# Patient Record
Sex: Female | Born: 1950
Health system: Southern US, Community
[De-identification: ages and names within clinical notes are randomized; demographics above are authoritative.]

## PROBLEM LIST (undated history)

## (undated) ENCOUNTER — Emergency Department (HOSPITAL_BASED_OUTPATIENT_CLINIC_OR_DEPARTMENT_OTHER): Admission: EM | Payer: Medicaid Other | Source: Home / Self Care

## (undated) DIAGNOSIS — M79606 Pain in leg, unspecified: Secondary | ICD-10-CM

## (undated) DIAGNOSIS — M542 Cervicalgia: Secondary | ICD-10-CM

## (undated) DIAGNOSIS — M5416 Radiculopathy, lumbar region: Secondary | ICD-10-CM

## (undated) DIAGNOSIS — K589 Irritable bowel syndrome without diarrhea: Secondary | ICD-10-CM

## (undated) DIAGNOSIS — M797 Fibromyalgia: Secondary | ICD-10-CM

## (undated) DIAGNOSIS — R109 Unspecified abdominal pain: Secondary | ICD-10-CM

## (undated) DIAGNOSIS — I10 Essential (primary) hypertension: Secondary | ICD-10-CM

## (undated) DIAGNOSIS — M549 Dorsalgia, unspecified: Secondary | ICD-10-CM

## (undated) DIAGNOSIS — G8929 Other chronic pain: Secondary | ICD-10-CM

## (undated) DIAGNOSIS — M21969 Unspecified acquired deformity of unspecified lower leg: Secondary | ICD-10-CM

## (undated) DIAGNOSIS — G629 Polyneuropathy, unspecified: Secondary | ICD-10-CM

## (undated) DIAGNOSIS — M7660 Achilles tendinitis, unspecified leg: Secondary | ICD-10-CM

## (undated) HISTORY — DX: Unspecified acquired deformity of unspecified lower leg: M21.969

## (undated) HISTORY — DX: Radiculopathy, lumbar region: M54.16

## (undated) HISTORY — PX: FOOT SURGERY: SHX648

## (undated) HISTORY — DX: Achilles tendinitis, unspecified leg: M76.60

## (undated) HISTORY — DX: Polyneuropathy, unspecified: G62.9

---

## 1997-07-23 ENCOUNTER — Emergency Department (HOSPITAL_COMMUNITY): Admission: EM | Admit: 1997-07-23 | Discharge: 1997-07-23 | Payer: Self-pay | Admitting: Emergency Medicine

## 1998-04-25 ENCOUNTER — Encounter: Payer: Self-pay | Admitting: Internal Medicine

## 1998-04-25 ENCOUNTER — Emergency Department (HOSPITAL_COMMUNITY): Admission: EM | Admit: 1998-04-25 | Discharge: 1998-04-25 | Payer: Self-pay | Admitting: Internal Medicine

## 1998-04-26 ENCOUNTER — Encounter: Payer: Self-pay | Admitting: Emergency Medicine

## 1998-04-26 ENCOUNTER — Emergency Department (HOSPITAL_COMMUNITY): Admission: EM | Admit: 1998-04-26 | Discharge: 1998-04-26 | Payer: Self-pay | Admitting: Emergency Medicine

## 1998-09-11 ENCOUNTER — Emergency Department (HOSPITAL_COMMUNITY): Admission: EM | Admit: 1998-09-11 | Discharge: 1998-09-11 | Payer: Self-pay | Admitting: *Deleted

## 1998-11-20 ENCOUNTER — Emergency Department (HOSPITAL_COMMUNITY): Admission: EM | Admit: 1998-11-20 | Discharge: 1998-11-20 | Payer: Self-pay | Admitting: Emergency Medicine

## 1998-11-22 ENCOUNTER — Encounter: Admission: RE | Admit: 1998-11-22 | Discharge: 1998-11-22 | Payer: Self-pay | Admitting: Internal Medicine

## 1998-11-29 ENCOUNTER — Encounter: Admission: RE | Admit: 1998-11-29 | Discharge: 1998-11-29 | Payer: Self-pay | Admitting: Internal Medicine

## 1998-12-02 ENCOUNTER — Encounter: Payer: Self-pay | Admitting: Emergency Medicine

## 1998-12-02 ENCOUNTER — Emergency Department (HOSPITAL_COMMUNITY): Admission: EM | Admit: 1998-12-02 | Discharge: 1998-12-02 | Payer: Self-pay | Admitting: Emergency Medicine

## 1998-12-13 ENCOUNTER — Emergency Department (HOSPITAL_COMMUNITY): Admission: EM | Admit: 1998-12-13 | Discharge: 1998-12-13 | Payer: Self-pay | Admitting: Emergency Medicine

## 1998-12-13 ENCOUNTER — Encounter: Payer: Self-pay | Admitting: Emergency Medicine

## 1999-09-17 ENCOUNTER — Emergency Department (HOSPITAL_COMMUNITY): Admission: EM | Admit: 1999-09-17 | Discharge: 1999-09-17 | Payer: Self-pay | Admitting: Emergency Medicine

## 1999-10-12 ENCOUNTER — Emergency Department (HOSPITAL_COMMUNITY): Admission: EM | Admit: 1999-10-12 | Discharge: 1999-10-12 | Payer: Self-pay | Admitting: Emergency Medicine

## 1999-10-12 ENCOUNTER — Encounter: Payer: Self-pay | Admitting: Emergency Medicine

## 2000-01-30 ENCOUNTER — Emergency Department (HOSPITAL_COMMUNITY): Admission: EM | Admit: 2000-01-30 | Discharge: 2000-01-30 | Payer: Self-pay | Admitting: Emergency Medicine

## 2000-05-16 ENCOUNTER — Emergency Department (HOSPITAL_COMMUNITY): Admission: EM | Admit: 2000-05-16 | Discharge: 2000-05-17 | Payer: Self-pay | Admitting: Emergency Medicine

## 2000-05-16 ENCOUNTER — Encounter: Payer: Self-pay | Admitting: Emergency Medicine

## 2000-05-29 ENCOUNTER — Encounter: Payer: Self-pay | Admitting: Internal Medicine

## 2000-05-29 ENCOUNTER — Ambulatory Visit (HOSPITAL_COMMUNITY): Admission: RE | Admit: 2000-05-29 | Discharge: 2000-05-29 | Payer: Self-pay | Admitting: Internal Medicine

## 2000-10-30 ENCOUNTER — Encounter: Admission: RE | Admit: 2000-10-30 | Discharge: 2000-11-11 | Payer: Self-pay | Admitting: Sports Medicine

## 2000-10-31 ENCOUNTER — Other Ambulatory Visit: Admission: RE | Admit: 2000-10-31 | Discharge: 2000-10-31 | Payer: Self-pay | Admitting: Internal Medicine

## 2000-10-31 ENCOUNTER — Encounter (INDEPENDENT_AMBULATORY_CARE_PROVIDER_SITE_OTHER): Payer: Self-pay | Admitting: Specialist

## 2000-12-02 ENCOUNTER — Encounter: Admission: RE | Admit: 2000-12-02 | Discharge: 2001-02-25 | Payer: Self-pay | Admitting: Sports Medicine

## 2001-01-08 HISTORY — PX: OTHER SURGICAL HISTORY: SHX169

## 2001-03-11 ENCOUNTER — Encounter: Payer: Self-pay | Admitting: Internal Medicine

## 2001-09-07 ENCOUNTER — Emergency Department (HOSPITAL_COMMUNITY): Admission: EM | Admit: 2001-09-07 | Discharge: 2001-09-07 | Payer: Self-pay | Admitting: Emergency Medicine

## 2001-09-23 ENCOUNTER — Emergency Department (HOSPITAL_COMMUNITY): Admission: EM | Admit: 2001-09-23 | Discharge: 2001-09-23 | Payer: Self-pay | Admitting: Emergency Medicine

## 2001-09-23 ENCOUNTER — Emergency Department (HOSPITAL_COMMUNITY): Admission: EM | Admit: 2001-09-23 | Discharge: 2001-09-23 | Payer: Self-pay | Admitting: Physical Therapy

## 2001-12-10 ENCOUNTER — Encounter: Admission: RE | Admit: 2001-12-10 | Discharge: 2001-12-10 | Payer: Self-pay | Admitting: Internal Medicine

## 2002-08-10 ENCOUNTER — Emergency Department (HOSPITAL_COMMUNITY): Admission: EM | Admit: 2002-08-10 | Discharge: 2002-08-10 | Payer: Self-pay | Admitting: Emergency Medicine

## 2002-08-15 ENCOUNTER — Encounter: Payer: Self-pay | Admitting: Emergency Medicine

## 2002-08-15 ENCOUNTER — Ambulatory Visit (HOSPITAL_COMMUNITY): Admission: RE | Admit: 2002-08-15 | Discharge: 2002-08-15 | Payer: Self-pay | Admitting: Emergency Medicine

## 2002-09-30 ENCOUNTER — Emergency Department (HOSPITAL_COMMUNITY): Admission: EM | Admit: 2002-09-30 | Discharge: 2002-10-01 | Payer: Self-pay | Admitting: Emergency Medicine

## 2002-10-01 ENCOUNTER — Encounter: Payer: Self-pay | Admitting: Emergency Medicine

## 2002-12-31 ENCOUNTER — Encounter: Admission: RE | Admit: 2002-12-31 | Discharge: 2003-02-04 | Payer: Self-pay | Admitting: Sports Medicine

## 2003-01-26 ENCOUNTER — Emergency Department (HOSPITAL_COMMUNITY): Admission: EM | Admit: 2003-01-26 | Discharge: 2003-01-26 | Payer: Self-pay

## 2003-02-17 ENCOUNTER — Ambulatory Visit (HOSPITAL_COMMUNITY): Admission: RE | Admit: 2003-02-17 | Discharge: 2003-02-17 | Payer: Self-pay | Admitting: Sports Medicine

## 2003-03-05 ENCOUNTER — Encounter: Payer: Self-pay | Admitting: Internal Medicine

## 2003-03-12 ENCOUNTER — Encounter: Admission: RE | Admit: 2003-03-12 | Discharge: 2003-03-12 | Payer: Self-pay | Admitting: Internal Medicine

## 2003-03-23 ENCOUNTER — Encounter: Admission: RE | Admit: 2003-03-23 | Discharge: 2003-03-23 | Payer: Self-pay | Admitting: Sports Medicine

## 2003-03-26 ENCOUNTER — Encounter: Admission: RE | Admit: 2003-03-26 | Discharge: 2003-03-26 | Payer: Self-pay | Admitting: Internal Medicine

## 2003-04-01 ENCOUNTER — Encounter: Admission: RE | Admit: 2003-04-01 | Discharge: 2003-04-01 | Payer: Self-pay | Admitting: Internal Medicine

## 2003-09-30 ENCOUNTER — Emergency Department (HOSPITAL_COMMUNITY): Admission: EM | Admit: 2003-09-30 | Discharge: 2003-09-30 | Payer: Self-pay | Admitting: Family Medicine

## 2003-10-31 ENCOUNTER — Emergency Department (HOSPITAL_COMMUNITY): Admission: EM | Admit: 2003-10-31 | Discharge: 2003-10-31 | Payer: Self-pay | Admitting: *Deleted

## 2003-12-15 ENCOUNTER — Ambulatory Visit: Payer: Self-pay | Admitting: Internal Medicine

## 2003-12-15 ENCOUNTER — Ambulatory Visit (HOSPITAL_COMMUNITY): Admission: RE | Admit: 2003-12-15 | Discharge: 2003-12-15 | Payer: Self-pay | Admitting: Internal Medicine

## 2004-01-08 ENCOUNTER — Emergency Department (HOSPITAL_COMMUNITY): Admission: EM | Admit: 2004-01-08 | Discharge: 2004-01-08 | Payer: Self-pay | Admitting: Emergency Medicine

## 2004-01-09 ENCOUNTER — Emergency Department (HOSPITAL_COMMUNITY): Admission: EM | Admit: 2004-01-09 | Discharge: 2004-01-09 | Payer: Self-pay | Admitting: Family Medicine

## 2004-01-12 ENCOUNTER — Ambulatory Visit: Payer: Self-pay | Admitting: Internal Medicine

## 2004-03-08 ENCOUNTER — Emergency Department (HOSPITAL_COMMUNITY): Admission: EM | Admit: 2004-03-08 | Discharge: 2004-03-08 | Payer: Self-pay | Admitting: Family Medicine

## 2004-05-04 ENCOUNTER — Emergency Department (HOSPITAL_COMMUNITY): Admission: EM | Admit: 2004-05-04 | Discharge: 2004-05-04 | Payer: Self-pay | Admitting: Family Medicine

## 2004-05-15 ENCOUNTER — Ambulatory Visit: Payer: Self-pay | Admitting: Internal Medicine

## 2004-05-15 ENCOUNTER — Ambulatory Visit (HOSPITAL_COMMUNITY): Admission: RE | Admit: 2004-05-15 | Discharge: 2004-05-15 | Payer: Self-pay | Admitting: Internal Medicine

## 2004-07-06 ENCOUNTER — Emergency Department (HOSPITAL_COMMUNITY): Admission: EM | Admit: 2004-07-06 | Discharge: 2004-07-06 | Payer: Self-pay | Admitting: Family Medicine

## 2004-07-18 ENCOUNTER — Ambulatory Visit: Payer: Self-pay | Admitting: Internal Medicine

## 2004-07-19 ENCOUNTER — Ambulatory Visit: Payer: Self-pay | Admitting: Internal Medicine

## 2004-08-02 ENCOUNTER — Ambulatory Visit: Payer: Self-pay

## 2004-09-18 ENCOUNTER — Emergency Department (HOSPITAL_COMMUNITY): Admission: EM | Admit: 2004-09-18 | Discharge: 2004-09-18 | Payer: Self-pay | Admitting: Emergency Medicine

## 2004-12-27 ENCOUNTER — Ambulatory Visit: Payer: Self-pay | Admitting: Internal Medicine

## 2005-06-13 ENCOUNTER — Emergency Department (HOSPITAL_COMMUNITY): Admission: EM | Admit: 2005-06-13 | Discharge: 2005-06-14 | Payer: Self-pay | Admitting: Emergency Medicine

## 2005-06-18 ENCOUNTER — Ambulatory Visit: Payer: Self-pay | Admitting: Internal Medicine

## 2005-06-19 ENCOUNTER — Emergency Department (HOSPITAL_COMMUNITY): Admission: EM | Admit: 2005-06-19 | Discharge: 2005-06-19 | Payer: Self-pay | Admitting: Emergency Medicine

## 2005-06-26 ENCOUNTER — Ambulatory Visit: Payer: Self-pay | Admitting: Internal Medicine

## 2005-07-31 ENCOUNTER — Emergency Department (HOSPITAL_COMMUNITY): Admission: EM | Admit: 2005-07-31 | Discharge: 2005-07-31 | Payer: Self-pay | Admitting: Emergency Medicine

## 2005-07-31 ENCOUNTER — Encounter: Payer: Self-pay | Admitting: Vascular Surgery

## 2005-08-10 ENCOUNTER — Emergency Department (HOSPITAL_COMMUNITY): Admission: EM | Admit: 2005-08-10 | Discharge: 2005-08-10 | Payer: Self-pay | Admitting: Emergency Medicine

## 2006-02-17 ENCOUNTER — Emergency Department (HOSPITAL_COMMUNITY): Admission: EM | Admit: 2006-02-17 | Discharge: 2006-02-17 | Payer: Self-pay | Admitting: Emergency Medicine

## 2006-03-11 ENCOUNTER — Emergency Department (HOSPITAL_COMMUNITY): Admission: EM | Admit: 2006-03-11 | Discharge: 2006-03-11 | Payer: Self-pay | Admitting: Family Medicine

## 2006-05-18 ENCOUNTER — Emergency Department (HOSPITAL_COMMUNITY): Admission: EM | Admit: 2006-05-18 | Discharge: 2006-05-18 | Payer: Self-pay | Admitting: Emergency Medicine

## 2006-07-06 ENCOUNTER — Emergency Department (HOSPITAL_COMMUNITY): Admission: EM | Admit: 2006-07-06 | Discharge: 2006-07-07 | Payer: Self-pay | Admitting: Occupational Medicine

## 2006-07-27 ENCOUNTER — Emergency Department (HOSPITAL_COMMUNITY): Admission: EM | Admit: 2006-07-27 | Discharge: 2006-07-27 | Payer: Self-pay | Admitting: Emergency Medicine

## 2006-12-14 ENCOUNTER — Emergency Department (HOSPITAL_COMMUNITY): Admission: EM | Admit: 2006-12-14 | Discharge: 2006-12-14 | Payer: Self-pay | Admitting: Emergency Medicine

## 2008-05-12 ENCOUNTER — Emergency Department (HOSPITAL_COMMUNITY): Admission: EM | Admit: 2008-05-12 | Discharge: 2008-05-13 | Payer: Self-pay | Admitting: Emergency Medicine

## 2008-08-28 ENCOUNTER — Emergency Department (HOSPITAL_COMMUNITY): Admission: EM | Admit: 2008-08-28 | Discharge: 2008-08-29 | Payer: Self-pay | Admitting: Emergency Medicine

## 2008-12-02 ENCOUNTER — Emergency Department (HOSPITAL_COMMUNITY): Admission: EM | Admit: 2008-12-02 | Discharge: 2008-12-02 | Payer: Self-pay | Admitting: Emergency Medicine

## 2008-12-02 ENCOUNTER — Encounter: Payer: Self-pay | Admitting: Physician Assistant

## 2008-12-02 ENCOUNTER — Encounter (INDEPENDENT_AMBULATORY_CARE_PROVIDER_SITE_OTHER): Payer: Self-pay | Admitting: *Deleted

## 2008-12-06 ENCOUNTER — Telehealth: Payer: Self-pay | Admitting: Internal Medicine

## 2008-12-07 ENCOUNTER — Encounter: Admission: RE | Admit: 2008-12-07 | Discharge: 2008-12-07 | Payer: Self-pay | Admitting: Podiatry

## 2008-12-07 ENCOUNTER — Ambulatory Visit: Payer: Self-pay | Admitting: Internal Medicine

## 2008-12-07 DIAGNOSIS — R1084 Generalized abdominal pain: Secondary | ICD-10-CM | POA: Insufficient documentation

## 2008-12-07 DIAGNOSIS — R634 Abnormal weight loss: Secondary | ICD-10-CM | POA: Insufficient documentation

## 2008-12-10 LAB — CONVERTED CEMR LAB
Sed Rate: 25 mm/hr — ABNORMAL HIGH (ref 0–22)
TSH: 1.41 microintl units/mL (ref 0.35–5.50)

## 2008-12-21 ENCOUNTER — Ambulatory Visit: Payer: Self-pay | Admitting: Internal Medicine

## 2008-12-22 ENCOUNTER — Telehealth: Payer: Self-pay | Admitting: Internal Medicine

## 2008-12-23 ENCOUNTER — Encounter: Payer: Self-pay | Admitting: Internal Medicine

## 2008-12-24 LAB — CONVERTED CEMR LAB: UREASE: POSITIVE

## 2008-12-27 ENCOUNTER — Encounter: Payer: Self-pay | Admitting: Internal Medicine

## 2008-12-29 ENCOUNTER — Encounter (INDEPENDENT_AMBULATORY_CARE_PROVIDER_SITE_OTHER): Payer: Self-pay | Admitting: *Deleted

## 2009-01-03 ENCOUNTER — Emergency Department (HOSPITAL_COMMUNITY): Admission: EM | Admit: 2009-01-03 | Discharge: 2009-01-04 | Payer: Self-pay | Admitting: Emergency Medicine

## 2009-01-03 ENCOUNTER — Encounter: Admission: RE | Admit: 2009-01-03 | Discharge: 2009-01-03 | Payer: Self-pay | Admitting: Sports Medicine

## 2009-01-05 ENCOUNTER — Encounter: Admission: RE | Admit: 2009-01-05 | Discharge: 2009-01-05 | Payer: Self-pay | Admitting: Sports Medicine

## 2009-01-19 ENCOUNTER — Emergency Department (HOSPITAL_COMMUNITY): Admission: EM | Admit: 2009-01-19 | Discharge: 2009-01-19 | Payer: Self-pay | Admitting: Emergency Medicine

## 2009-02-10 DIAGNOSIS — A048 Other specified bacterial intestinal infections: Secondary | ICD-10-CM | POA: Insufficient documentation

## 2009-02-10 DIAGNOSIS — Z860100 Personal history of colon polyps, unspecified: Secondary | ICD-10-CM | POA: Insufficient documentation

## 2009-02-10 DIAGNOSIS — K589 Irritable bowel syndrome without diarrhea: Secondary | ICD-10-CM | POA: Insufficient documentation

## 2009-02-10 DIAGNOSIS — E785 Hyperlipidemia, unspecified: Secondary | ICD-10-CM | POA: Insufficient documentation

## 2009-02-10 DIAGNOSIS — R109 Unspecified abdominal pain: Secondary | ICD-10-CM | POA: Insufficient documentation

## 2009-02-16 ENCOUNTER — Ambulatory Visit: Payer: Self-pay | Admitting: Internal Medicine

## 2009-02-16 DIAGNOSIS — R1013 Epigastric pain: Secondary | ICD-10-CM | POA: Insufficient documentation

## 2009-02-16 DIAGNOSIS — R142 Eructation: Secondary | ICD-10-CM

## 2009-02-16 DIAGNOSIS — R143 Flatulence: Secondary | ICD-10-CM

## 2009-02-16 DIAGNOSIS — R141 Gas pain: Secondary | ICD-10-CM | POA: Insufficient documentation

## 2009-02-17 ENCOUNTER — Ambulatory Visit: Payer: Self-pay | Admitting: Cardiology

## 2009-02-18 ENCOUNTER — Encounter: Payer: Self-pay | Admitting: Internal Medicine

## 2009-02-18 LAB — CONVERTED CEMR LAB
ALT: 26 units/L (ref 0–35)
Albumin: 3.7 g/dL (ref 3.5–5.2)
Basophils Absolute: 0 10*3/uL (ref 0.0–0.1)
CO2: 29 meq/L (ref 19–32)
Eosinophils Relative: 1.3 % (ref 0.0–5.0)
GFR calc non Af Amer: 83.1 mL/min (ref >60)
Glucose, Bld: 96 mg/dL (ref 70–99)
Lymphocytes Relative: 26.9 % (ref 12.0–46.0)
Lymphs Abs: 1.7 10*3/uL (ref 0.7–4.0)
Monocytes Relative: 8.5 % (ref 3.0–12.0)
Neutrophils Relative %: 62.7 % (ref 43.0–77.0)
Platelets: 305 10*3/uL (ref 150.0–400.0)
Potassium: 4.2 meq/L (ref 3.5–5.1)
RDW: 13.2 % (ref 11.5–14.6)
Sodium: 142 meq/L (ref 135–145)
Total Bilirubin: 0.4 mg/dL (ref 0.3–1.2)
Total Protein: 7.9 g/dL (ref 6.0–8.3)
WBC: 6.3 10*3/uL (ref 4.5–10.5)

## 2009-02-22 ENCOUNTER — Telehealth: Payer: Self-pay | Admitting: Internal Medicine

## 2009-02-25 ENCOUNTER — Telehealth: Payer: Self-pay | Admitting: Internal Medicine

## 2009-03-31 ENCOUNTER — Ambulatory Visit: Payer: Self-pay | Admitting: Internal Medicine

## 2009-03-31 LAB — CONVERTED CEMR LAB
Lipase: 22 units/L (ref 11.0–59.0)
TSH: 1.21 microintl units/mL (ref 0.35–5.50)

## 2009-04-04 ENCOUNTER — Ambulatory Visit (HOSPITAL_COMMUNITY): Admission: RE | Admit: 2009-04-04 | Discharge: 2009-04-04 | Payer: Self-pay | Admitting: Internal Medicine

## 2009-04-14 ENCOUNTER — Emergency Department (HOSPITAL_COMMUNITY): Admission: EM | Admit: 2009-04-14 | Discharge: 2009-04-14 | Payer: Self-pay | Admitting: Emergency Medicine

## 2009-04-15 ENCOUNTER — Telehealth: Payer: Self-pay | Admitting: Internal Medicine

## 2009-05-16 ENCOUNTER — Telehealth: Payer: Self-pay | Admitting: Internal Medicine

## 2009-07-07 ENCOUNTER — Emergency Department (HOSPITAL_COMMUNITY): Admission: EM | Admit: 2009-07-07 | Discharge: 2009-07-07 | Payer: Self-pay | Admitting: Emergency Medicine

## 2009-07-30 ENCOUNTER — Encounter (INDEPENDENT_AMBULATORY_CARE_PROVIDER_SITE_OTHER): Payer: Self-pay | Admitting: *Deleted

## 2009-07-30 ENCOUNTER — Emergency Department (HOSPITAL_COMMUNITY): Admission: EM | Admit: 2009-07-30 | Discharge: 2009-07-30 | Payer: Self-pay | Admitting: Emergency Medicine

## 2009-08-01 ENCOUNTER — Telehealth: Payer: Self-pay | Admitting: Internal Medicine

## 2009-08-03 ENCOUNTER — Encounter: Payer: Self-pay | Admitting: Internal Medicine

## 2009-08-03 ENCOUNTER — Telehealth (INDEPENDENT_AMBULATORY_CARE_PROVIDER_SITE_OTHER): Payer: Self-pay

## 2009-08-10 ENCOUNTER — Emergency Department (HOSPITAL_COMMUNITY): Admission: EM | Admit: 2009-08-10 | Discharge: 2009-08-10 | Payer: Self-pay | Admitting: Emergency Medicine

## 2009-08-15 ENCOUNTER — Ambulatory Visit: Payer: Self-pay | Admitting: Internal Medicine

## 2009-09-20 ENCOUNTER — Ambulatory Visit: Payer: Self-pay | Admitting: Internal Medicine

## 2009-09-21 ENCOUNTER — Telehealth (INDEPENDENT_AMBULATORY_CARE_PROVIDER_SITE_OTHER): Payer: Self-pay | Admitting: *Deleted

## 2009-11-13 ENCOUNTER — Encounter: Payer: Self-pay | Admitting: Internal Medicine

## 2010-01-14 ENCOUNTER — Emergency Department (HOSPITAL_COMMUNITY)
Admission: EM | Admit: 2010-01-14 | Discharge: 2010-01-15 | Payer: Self-pay | Source: Home / Self Care | Admitting: Emergency Medicine

## 2010-01-15 ENCOUNTER — Observation Stay (HOSPITAL_COMMUNITY)
Admission: EM | Admit: 2010-01-15 | Discharge: 2010-01-16 | Payer: Self-pay | Source: Home / Self Care | Attending: Internal Medicine | Admitting: Internal Medicine

## 2010-01-23 LAB — BASIC METABOLIC PANEL
BUN: 13 mg/dL (ref 6–23)
BUN: 9 mg/dL (ref 6–23)
CO2: 28 mEq/L (ref 19–32)
CO2: 27 mEq/L (ref 19–32)
Calcium: 9.7 mg/dL (ref 8.4–10.5)
Calcium: 9.1 mg/dL (ref 8.4–10.5)
Chloride: 106 mEq/L (ref 96–112)
Chloride: 106 mEq/L (ref 96–112)
Creatinine, Ser: 0.89 mg/dL (ref 0.4–1.2)
Creatinine, Ser: 0.97 mg/dL (ref 0.4–1.2)
GFR calc Af Amer: >60 mL/min (ref >60)
GFR calc Af Amer: >60 mL/min (ref >60)
GFR calc non Af Amer: >60 mL/min (ref >60)
GFR calc non Af Amer: 59 mL/min — ABNORMAL LOW (ref >60)
Glucose, Bld: 108 mg/dL — ABNORMAL HIGH (ref 70–99)
Glucose, Bld: 100 mg/dL — ABNORMAL HIGH (ref 70–99)
Potassium: 3.9 mEq/L (ref 3.5–5.1)
Potassium: 3.8 mEq/L (ref 3.5–5.1)
Sodium: 142 mEq/L (ref 135–145)
Sodium: 141 mEq/L (ref 135–145)

## 2010-01-23 LAB — CBC
HCT: 41.3 % (ref 36.0–46.0)
HCT: 38.5 % (ref 36.0–46.0)
Hemoglobin: 13.4 g/dL (ref 12.0–15.0)
Hemoglobin: 11.8 g/dL — ABNORMAL LOW (ref 12.0–15.0)
MCH: 27.5 pg (ref 26.0–34.0)
MCH: 26.1 pg (ref 26.0–34.0)
MCHC: 32.4 g/dL (ref 30.0–36.0)
MCHC: 30.6 g/dL (ref 30.0–36.0)
MCV: 84.8 fL (ref 78.0–100.0)
MCV: 85.2 fL (ref 78.0–100.0)
Platelets: 315 10*3/uL (ref 150–400)
Platelets: 292 10*3/uL (ref 150–400)
RBC: 4.87 MIL/uL (ref 3.87–5.11)
RBC: 4.52 MIL/uL (ref 3.87–5.11)
RDW: 13.9 % (ref 11.5–15.5)
RDW: 14 % (ref 11.5–15.5)
WBC: 11.8 10*3/uL — ABNORMAL HIGH (ref 4.0–10.5)
WBC: 8.8 10*3/uL (ref 4.0–10.5)

## 2010-01-23 LAB — HEPATIC FUNCTION PANEL
ALT: 21 U/L (ref 0–35)
AST: 22 U/L (ref 0–37)
Albumin: 3.7 g/dL (ref 3.5–5.2)
Alkaline Phosphatase: 87 U/L (ref 39–117)
Bilirubin, Direct: 0.1 mg/dL (ref 0.0–0.3)
Indirect Bilirubin: 0.4 mg/dL (ref 0.3–0.9)
Total Bilirubin: 0.5 mg/dL (ref 0.3–1.2)
Total Protein: 7.9 g/dL (ref 6.0–8.3)

## 2010-01-23 LAB — URINALYSIS, ROUTINE W REFLEX MICROSCOPIC
Bilirubin Urine: NEGATIVE
Hgb urine dipstick: NEGATIVE
Ketones, ur: 15 mg/dL — AB
Nitrite: NEGATIVE
Protein, ur: NEGATIVE mg/dL
Specific Gravity, Urine: 1.016 (ref 1.005–1.030)
Urine Glucose, Fasting: NEGATIVE mg/dL
Urobilinogen, UA: 1 mg/dL (ref 0.0–1.0)
pH: 6.5 (ref 5.0–8.0)

## 2010-01-23 LAB — LIPASE, BLOOD: Lipase: 50 U/L (ref 11–59)

## 2010-01-23 LAB — DIFFERENTIAL
Basophils Absolute: 0 10*3/uL (ref 0.0–0.1)
Basophils Absolute: 0 10*3/uL (ref 0.0–0.1)
Basophils Relative: 0 % (ref 0–1)
Basophils Relative: 0 % (ref 0–1)
Eosinophils Absolute: 0 10*3/uL (ref 0.0–0.7)
Eosinophils Absolute: 0 10*3/uL (ref 0.0–0.7)
Eosinophils Relative: 0 % (ref 0–5)
Eosinophils Relative: 1 % (ref 0–5)
Lymphocytes Relative: 8 % — ABNORMAL LOW (ref 12–46)
Lymphocytes Relative: 26 % (ref 12–46)
Lymphs Abs: 1 10*3/uL (ref 0.7–4.0)
Lymphs Abs: 2.3 10*3/uL (ref 0.7–4.0)
Monocytes Absolute: 0.4 10*3/uL (ref 0.1–1.0)
Monocytes Absolute: 0.7 10*3/uL (ref 0.1–1.0)
Monocytes Relative: 3 % (ref 3–12)
Monocytes Relative: 8 % (ref 3–12)
Neutro Abs: 10.5 10*3/uL — ABNORMAL HIGH (ref 1.7–7.7)
Neutro Abs: 5.7 10*3/uL (ref 1.7–7.7)
Neutrophils Relative %: 88 % — ABNORMAL HIGH (ref 43–77)
Neutrophils Relative %: 65 % (ref 43–77)

## 2010-01-28 ENCOUNTER — Encounter: Payer: Self-pay | Admitting: Internal Medicine

## 2010-01-28 ENCOUNTER — Encounter: Payer: Self-pay | Admitting: Sports Medicine

## 2010-01-29 ENCOUNTER — Encounter: Payer: Self-pay | Admitting: Internal Medicine

## 2010-01-29 ENCOUNTER — Encounter: Payer: Self-pay | Admitting: Podiatry

## 2010-02-07 NOTE — Assessment & Plan Note (Signed)
Summary: ecl f/u...as.   History of Present Illness Visit Type: Follow-up Visit Primary GI MD: Lina Sar MD Primary Provider: n/a Chief Complaint: F/U Endo, patient having epigastric  pain and bloating History of Present Illness:   This is a 60 year old African American female with intermittent mid abdominal pain and lower abdominal pain which is crampy and lasts about one day. She denies any change in bowel habits which seem to be regular. She denies any rectal bleeding. An upper endoscopy and colonoscopy done in December 2010 was positive for H. pylori and she she also had 3 hyperplastic polyps. The colon was redundant and the cecal pouch was never visualized completely. Her sedimentation rate was slightly elevated at 25. Most of her abdominal pain started after her mother passed away last year. She had weight loss but since then has gained all the way back to her original weight. She was evaluated in the emergency room in November 2010 for abdominal pain but all tests were negative.   GI Review of Systems    Reports abdominal pain and  bloating.     Location of  Abdominal pain: epigastric area.    Denies acid reflux, belching, chest pain, dysphagia with liquids, dysphagia with solids, heartburn, loss of appetite, nausea, vomiting, vomiting blood, weight loss, and  weight gain.        Denies anal fissure, black tarry stools, change in bowel habit, constipation, diarrhea, diverticulosis, fecal incontinence, heme positive stool, hemorrhoids, irritable bowel syndrome, jaundice, light color stool, liver problems, rectal bleeding, and  rectal pain.    Current Medications (verified): 1)  None  Allergies (verified): 1)  ! Penicillin  Past History:  Past Medical History: Reviewed history from 02/10/2009 and no changes required. Arthritis  COLONIC POLYPS, HYPERPLASTIC, HX OF (ICD-V12.72) HELICOBACTER PYLORI INFECTION (ICD-041.86) HYPERLIPIDEMIA (ICD-272.4) IRRITABLE BOWEL SYNDROME  (ICD-564.1) ABDOMINAL PAIN, CHRONIC (ICD-789.00) LOSS OF WEIGHT (ICD-783.21) ABDOMINAL PAIN, GENERALIZED (ICD-789.07)    Past Surgical History: Tubal Ligation Eye Surgery- for a growth on eyelid Foot Surgery  Family History: Reviewed history from 02/10/2009 and no changes required. Family History of Diabetes: Mother Family History of Heart Disease: Mother, Maternal Grandmother   Social History: Reviewed history from 12/07/2008 and no changes required. Married Patient has never smoked.  Alcohol Use - no Daily Caffeine Use Illicit Drug Use - no  Review of Systems       The patient complains of anxiety-new, arthritis/joint pain, back pain, cough, depression-new, fainting, fatigue, headaches-new, muscle pains/cramps, sleeping problems, and urine leakage.         Pertinent positive and negative review of systems were noted in the above HPI. All other ROS was otherwise negative.   Vital Signs:  Patient profile:   60 year old female Height:      67 inches Weight:      178 pounds BMI:     27.98 Pulse rate:   72 / minute Pulse rhythm:   regular BP sitting:   124 / 82  (left arm) Cuff size:   regular  Vitals Entered By: June McMurray CMA Duncan Dull) (February 16, 2009 9:26 AM)  Physical Exam  General:  Well developed, well nourished, no acute distress. Mouth:  No deformity or lesions, dentition normal. Neck:  Supple; no masses or thyromegaly. Lungs:  Clear throughout to auscultation. Heart:  Regular rate and rhythm; no murmurs, rubs,  or bruits. Abdomen:  protuberant abdomen which is soft with normoactive bowel sounds. No distention. Mild tenderness in left lower and  left middle quadrant, no CVA tenderness. Liver edge at costal margin. Rectal:  large amount of solid Hemoccult-negative stool in the rectal ampulla. Extremities:  No clubbing, cyanosis, edema or deformities noted. Skin:  Intact without significant lesions or rashes. Psych:  Alert and cooperative. Normal mood and  affect.   Impression & Recommendations:  Problem # 1:  ABDOMINAL BLOATING (ICD-787.3) Patient has lower abdominal pain, probably functional but I cannot rule out organic disease. We will schedule a CT scan of the abdomen and pelvis. We will also obtain a CBC and CMET. The pain may be due to constipation. I have given her samples of MiraLax 9-17 g p.r.n. and phenergan 25 mg p.r.n. nausea. Orders: CT Abdomen/Pelvis with Contrast (CT Abd/Pelvis w/con) TLB-CBC Platelet - w/Differential (85025-CBCD) TLB-CMP (Comprehensive Metabolic Pnl) (80053-COMP)  Problem # 2:  COLONIC POLYPS, HYPERPLASTIC, HX OF (ICD-V12.72) Patient has a history of hyperplastic colon polyps. She will be due for a recall colonoscopy in 10 years.  Problem # 3:  HELICOBACTER PYLORI INFECTION (ICD-041.86) Patient had a positive H. pylori on her last upper endoscopy. Patient does not have any upper GI symptoms and therefore we will not treat it at this time.  Patient Instructions: 1)  MiraLax 9-17 g p.r.n. 2)  CT Scan of the abdomen and pelvis. 3)  CBC and metabolic panel. 4)  Phenergan 25 mg dispensed 20 p.r.n. nausea. 5)  The medication list was reviewed and reconciled.  All changed / newly prescribed medications were explained.  A complete medication list was provided to the patient / caregiver. Prescriptions: PROMETHAZINE HCL 25 MG TABS (PROMETHAZINE HCL) Take 1 tablet by mouth every 4-6 hours as needed for nausea  #20 x 0   Entered by:   Hortense Ramal CMA (AAMA)   Authorized by:   Hart Carwin MD   Signed by:   Hortense Ramal CMA (AAMA) on 02/16/2009   Method used:   Electronically to        [facility] (retail)       [address]       [place], [place]  [id number]       Ph: [phone]       Fax: [phone]   RxID:   [id number] MIRALAX   POWD (POLYETHYLENE GLYCOL 3350) Take 1/2 scoop by mouth as needed abdominal pain and cramping  #527 g x 1   Entered by:   Hortense Ramal CMA  (AAMA)   Authorized by:   Hart Carwin MD   Signed by:   Hortense Ramal CMA (AAMA) on 02/16/2009   Method used:   Electronically to        [facility] (retail)       [address]       [place], [place]  [id number]       Ph: [phone]       Fax: [phone]   RxID:   [id number]

## 2010-02-07 NOTE — Letter (Signed)
Summary: Results Letter  [facility]  [address] [place], [place] [address]   Phone: [phone]  Fax: [phone]        February 18, 2009 MRN: [id number]    [facility] [place] [address] [address] [place], [place]  [address]    Dear Ms. Caissie,     After multiple attempts to reach you by phone I am sending this letter. Dr.Eugenie Harewood wants you to know that your labs are normal. Your CT is normal, no cancer, everything looks good. You do have a fatty liver, I have enclosed a brochure on that condition for you. You are also slightly constipated, Dr.Nat Lowenthal recommends you use a laxative or stool softner as needed. If you have any questions please feel free to call us at [phone]. Thank You.       Sincerely,  Laureen Ochs LPN  This letter has been electronically signed by your physician.  Appended Document: Results Letter Letter mailed to patient.

## 2010-02-07 NOTE — Assessment & Plan Note (Signed)
Summary: Gastroenterology   Kelly Rosales  MR#:  [id number] Page #  [facility]   GASTROENTEROLOGY OFFICE NOTE  NAME:  Kelly, Rosales   OFFICE NO:  [id number]  DATE:  03/11/01  The patient is a 60 year old African American female with chronic abdominal pain and irritable bowel syndrome.  Her last evaluation with colonoscopy in October of 2002 showed a polyp in the rectum, which was removed.  She also has problems with constipation, low back pain, and most recently urinary frequency.  She was given Pyridium on the last office visit in November and has been taking it pretty much regularly on a daily basis.  She had requested a refill on Pyridium, but is was denied because of the need for her to be further evaluated for her symptoms.  PHYSICAL EXAMINATION:  Blood pressure 110/68, pulse 60, Weight 168 pounds.  Abdomen:  Exam showed surgical scars from previous pelvic surgeries.  Bowel sounds were active.  There was tenderness in the right lower quadrant.  Straight leg raising was negative.  There was mild right CVA tenderness.  Rectal exam:  Not done today.  IMPRESSION:  Urinary frequency relieved by Pyridium, rule out interstitial cystitis, rule out urinary tract infection or some urologic problem.  PLAN: 1.  Urinalysis today. 2.  Urine culture. 3.  One more refill on Pyridium.  We will no longer refill it since the patient needs further evaluation.  She needs an internist to sort this out.  I gave her names of some internists in our office.      Hedwig Morton. Juanda Chance, M.D.  [id number] D:  03/11/01; T:  ; Job [phone]

## 2010-02-07 NOTE — Assessment & Plan Note (Signed)
Summary: abdominal pain--ch   History of Present Illness Visit Type: Follow-up Visit Primary GI MD: Lina Sar MD Primary Provider: n/a Requesting Provider: n/a Chief Complaint: Intermittant abdominal pain, Occasional N/V History of Present Illness:   60 yo AA female with weight loss, normal EGD/colonoscopy 12/2008 and CT scan of the abd and pelvis, wakes up at night with abd. pain. She has been depressed since the death of her mother and granddaughter. There was a mention of cholelithiasis on one of her prior  imaging studies.    GI Review of Systems    Reports abdominal pain.     Location of  Abdominal pain: lower abdomen.    Denies acid reflux, belching, bloating, chest pain, dysphagia with liquids, dysphagia with solids, heartburn, loss of appetite, nausea, vomiting, vomiting blood, weight loss, and  weight gain.        Denies anal fissure, black tarry stools, change in bowel habit, constipation, diarrhea, diverticulosis, fecal incontinence, heme positive stool, hemorrhoids, irritable bowel syndrome, jaundice, light color stool, liver problems, rectal bleeding, and  rectal pain.    Current Medications (verified): 1)  Promethazine Hcl 25 Mg Tabs (Promethazine Hcl) .... Take 1 Tablet By Mouth Every 4-6 Hours As Needed For Nausea 2)  Donnatal  Tabs (Belladonna Alk-Phenobarbital) .... Take 1 Tablet By Mouth Three Times A Day Before Meals. (Pharmacy-Please D/c Librax Prescription)  Allergies (verified): 1)  ! Penicillin  Past History:  Past Medical History: Last updated: 02/10/2009 Arthritis  COLONIC POLYPS, HYPERPLASTIC, HX OF (ICD-V12.72) HELICOBACTER PYLORI INFECTION (ICD-041.86) HYPERLIPIDEMIA (ICD-272.4) IRRITABLE BOWEL SYNDROME (ICD-564.1) ABDOMINAL PAIN, CHRONIC (ICD-789.00) LOSS OF WEIGHT (ICD-783.21) ABDOMINAL PAIN, GENERALIZED (ICD-789.07)    Past Surgical History: Last updated: 02/16/2009 Tubal Ligation Eye Surgery- for a growth on eyelid Foot  Surgery  Family History: Last updated: 02/10/2009 Family History of Diabetes: Mother Family History of Heart Disease: Mother, Maternal Grandmother   Social History: Last updated: 12/07/2008 Married Patient has never smoked.  Alcohol Use - no Daily Caffeine Use Illicit Drug Use - no  Review of Systems       The patient complains of arthritis/joint pain.  The patient denies allergy/sinus, anemia, anxiety-new, back pain, blood in urine, breast changes/lumps, change in vision, confusion, cough, coughing up blood, depression-new, fainting, fatigue, fever, headaches-new, hearing problems, heart murmur, heart rhythm changes, itching, menstrual pain, muscle pains/cramps, night sweats, nosebleeds, pregnancy symptoms, shortness of breath, skin rash, sleeping problems, sore throat, swelling of feet/legs, swollen lymph glands, thirst - excessive , urination - excessive , urination changes/pain, urine leakage, vision changes, and voice change.         Pertinent positive and negative review of systems were noted in the above HPI. All other ROS was otherwise negative.   Vital Signs:  Patient profile:   60 year old female Height:      67 inches Weight:      179 pounds BMI:     28.14 BSA:     1.93 Pulse rate:   80 / minute Pulse rhythm:   regular BP sitting:   102 / 82  (left arm)  Vitals Entered By: Merri Ray CMA Duncan Dull) (March 31, 2009 10:17 AM)  Physical Exam  General:  appears depressed Eyes:  PERRLA, no icterus. Mouth:  No deformity or lesions, dentition normal. Neck:  Supple; no masses or thyromegaly. Lungs:  Clear throughout to auscultation. Heart:  Regular rate and rhythm; no murmurs, rubs,  or bruits. Abdomen:  soft, tender periumbilical area, no mass or hernia, liver edge  at CM, no ascites Extremities:  No clubbing, cyanosis, edema or deformities noted. Skin:  Intact without significant lesions or rashes. Psych:  Alert and cooperative. Normal mood and  affect.   Impression & Recommendations:  Problem # 1:  ABDOMINAL BLOATING (ICD-787.3) r/o biliary disease or partial SBO Orr/o biliary disease, r/oders: TLB-TSH (Thyroid Stimulating Hormone) (84443-TSH) TLB-Sedimentation Rate (ESR) (85652-ESR) TLB-Amylase (82150-AMYL) TLB-Lipase (83690-LIPASE) Ultrasound Abdomen (UAS) UGI with SBFT (UGI w/SBFT)  Problem # 2:  COLONIC POLYPS, HYPERPLASTIC, HX OF (ICD-V12.72) recall colon 10 years, last colon 12/2008  Problem # 3:  IRRITABLE BOWEL SYNDROME (ICD-564.1) abd. pain may be functional.   Patient Instructions: 1)  You have been scheduled for an abdominal ultrasound as well as an upper GI with small bowel follow thru on 04/05/09. Please arrive at 7:45 am on that day at [facility].  2)  After midnight on 04/04/09, you should have nothing to eat or drink until after your radiology tests. 3)  Please go to the basement floor to have labwork including: TSH, sedimentation rate, amylase, and lipase. 4)  Please pick up your prescription fo Valium 5 mg. That prescription has already been sent to your pharmacy by fax. 5)  The medication list was reviewed and reconciled.  All changed / newly prescribed medications were explained.  A complete medication list was provided to the patient / caregiver. Prescriptions: VALIUM 5 MG TABS (DIAZEPAM) Take 1 tablet by mouth as needed for severe pain  #30 x 0   Entered by:   Hortense Ramal CMA (AAMA)   Authorized by:   Hart Carwin MD   Signed by:   Hortense Ramal CMA (AAMA) on 03/31/2009   Method used:   Printed then faxed to ...       [facility] [address] [phone][facility] (retail)       [address]       [place], [place]  [address]       Ph: [phone] or [phone]       Fax: [phone]   RxID:   [id number]

## 2010-02-07 NOTE — Progress Notes (Signed)
Summary: ON CALL -phone note  Phone Note Call from Patient   Caller: Patient via Answering Service Summary of Call: patient called answering service for "vomiting and pain". Call returned but no answer. Looks like the office had trouble reaching her earlier today as well... Initial call taken by: Hilarie Fredrickson MD,  August 01, 2009 7:10 PM  Follow-up for Phone Call        pt known to me, chronic functional abd. pain. Extensive GI work up is negative.  We need to be careful dispensing pain meds. Follow-up by: Hart Carwin MD,  August 01, 2009 10:25 PM

## 2010-02-07 NOTE — Cardiovascular Report (Signed)
Summary: Certified Letter Returned  Certified Letter Returned   Imported By: Debby Freiberg 11/22/2009 11:23:41  _____________________________________________________________________  External Attachment:    Type:   Image     Comment:   External Document

## 2010-02-07 NOTE — Progress Notes (Signed)
Summary: Dismissal Letter Mailed by Certified Mail  Dismissal Letter sent by certified mail. Vara Guardian  September 21, 2009 11:14 AM

## 2010-02-07 NOTE — Letter (Signed)
Summary: GASTROENTEROLOGY DISCHARGE LETTER!  [facility]  [address] [place], [place] [address]   Phone: [phone]  Fax: [phone]             09/20/2009 MRN: [id number]  Kelly Rosales [address] [address] [place], [place]  [address]  Dear Ms. Pizano,   I find it necessary to inform you that I will not be able to provide medical care to you, because of your multiple late cancellations and failure to show for appointments. This seems to have been an ongoing problem for which we have actually discharged you before. The continuation of this behavior confirms our reasoning for your previous discharge as well as for this discharge letter. There will be no reversal of this decision.  Since your condition requires medical attention, I suggest that you place yourself under the care of another physician without delay. If you desire, I will be available for emergency care for 30 days after you receive this letter.  This should give you ample time to select a physician of your choice from the many competent providers in this area. You may want to call the local medical society or [facility]'s physician referral service [phone]) for their assistance in locating a new physician. With your written authorization, I will make a copy of your medical record available to your new physician.  Sincerely,     Hedwig Morton. Juanda Chance, MD

## 2010-02-07 NOTE — Letter (Signed)
Summary: Unable to Reach Letter  [facility]  [address] [place], [place] [address]   Phone: [phone]  Fax: [phone]        August 03, 2009 MRN: [id number]    [facility] [address] [place], [place]  [address]    Dear Ms. Stokke,   After repeated attempts, I am again not able to reach you by phone.  I have scheduled you an office visit with Dr Juanda Chance on Monday 08/08/09 at 10:15.  I have called in a refill of phenergan for nausea and a refill of Prilosec.  Please go to your pharmacy and pick up both of these prescriptions and start on them.    Please bring any co-pays per your insurance company, and if you need to reschedule please do so by 5:00 on Friday 08/05/09 to avoid a $50.00 charge.  If you have persistent vomiting you will need to return to the Emergency room for evaluation.       Sincerely,  Darcey Nora RN, CGRN  This letter has been electronically signed by your physician.

## 2010-02-07 NOTE — Letter (Signed)
Summary: GASTROENTEROLOGY DISCHARGE LETTER  Quintana, Canelo    Office No. [id number] March 05, 2001    [address] [address] [place], [place] [address]  RE:     Casady, Voshell    Office No. [id number] DOB:  21-Apr-2050  Dear Ms. Fini:I am sorry to let you know that I will no longer be able to take care of your gastroenterological needs because you have not kept your appointments on July 15, 2002, September 02, 2002, and you did not cancel your CT scan of the abdomen and pelvis which was scheduled for you on February 02, 2003.  As you know, you had previously been dismissed from our practice in June 1998 and this just confirms our decision that we will no longer be able to provide medical care for you.  You will have four weeks to find another gastroenterologist.    Respectfully,     Hedwig Morton. Juanda Chance, M.D.  [id number] D:  03/05/03; T:  ; Job [id number]

## 2010-02-07 NOTE — Progress Notes (Signed)
Summary: TRIAGE-Abd.Pain  Phone Note Call from Patient Call back at Work Phone [phone] Call back at [phone] Pat   Caller: Patient Call For: Dr. Juanda Chance Reason for Call: Talk to Nurse Summary of Call: Pt is having abdominal pain and scheduled an appt. for 03-31-09 but wants to know if she can be worked in sooner Initial call taken by: Karna Christmas,  February 22, 2009 10:57 AM  Follow-up for Phone Call        Unable to leave a message at all 3 of the phone #'s I have for pt. [phone], [phone], [phone]. I will wait for her to callback.  ( I have been attempting to reach pt. with her lab/CT results, I tried multiple times over several days the last 2 weeks, I had to mail her a letter on 02-18-09 with those results.) Follow-up by: Laureen Ochs LPN,  February 22, 2009 11:32 AM  Additional Follow-up for Phone Call Additional follow up Details #1::        Unable to leave a message at [phone] & [phone]. Message left for patient to callback. Msg left on # [phone]. Laureen Ochs LPN  February 23, 2009 9:39 AM  Spoke w/Pat at [phone], she will let pt. know I returned her call and to call back. Additional Follow-up by: Laureen Ochs LPN,  February 24, 2009 8:40 AM    Additional Follow-up for Phone Call Additional follow up Details #2::    Pt. called and asked me to call her at [phone], Message left for patient to callback. Laureen Ochs LPN  February 24, 2009 1:03 PM   Last OV was 02-16-09. Pt. c/o ongoing RLQ pain, constant and getting worse. Nausea improved somewhat with Phenergan.Takes Miralax and has a BM daily.  Denies blood, black stools, fever.  1) Soft,bland diet. No spicy,greasy,fried foods.  2) tylenol/Ibuprofen as needed 3) Heating pad to abdomen as needed. 4) If symptoms become worse call back immediately or go to ER. 5) I will call pt., if new orders, after MD reviews.  DR.Glenis Musolf PLEASE ADVISE  Follow-up by: Laureen Ochs LPN,  February 25, 2009 10:08  AM  Additional Follow-up for Phone Call Additional follow up Details #3:: Details for Additional Follow-up Action Taken: Strongly suspect functional pain(IBS),  all labs ,EGD,colon,CT scan are negative. Please try Librax 1 potid,ac, #90, 1 refill. I don't see why she would have to be seen earlier. Additional Follow-up by: Hart Carwin MD,  February 25, 2009 10:29 AM  New/Updated Medications: LIBRAX 2.5-5 MG  CAPS (CLIDINIUM-CHLORDIAZEPOXIDE) Take one by mouth 3 times daily, before breakfast, lunch and dinner. Prescriptions: LIBRAX 2.5-5 MG  CAPS (CLIDINIUM-CHLORDIAZEPOXIDE) Take one by mouth 3 times daily, before breakfast, lunch and dinner.  #90 x 1   Entered by:   Laureen Ochs LPN   Authorized by:   Hart Carwin MD   Signed by:   Laureen Ochs LPN on 42/59/5638   Method used:   [facility]  [address] [phone]* (retail)       [address]       [place], [place]  [address]       Ph: [phone] or [phone]       Fax: [phone]   RxID:   [id number]  Above MD orders reviewed with patient. She will keep ov on 03-31-09 at 10:15am. Pt. instructed to call back as needed.

## 2010-02-07 NOTE — Progress Notes (Signed)
Summary: Nausea and Vomiting  Phone Note Call from Patient Call back at Work Phone [phone]   Caller: Patient Summary of Call: Patient  reports since Saturday  , she has had constant vomiting every 15 minutes.  Patient complains that she  called our office on Monday and the on call MD, several attempts were made to return the patient's phone call over 2 days and evenings and patient doesn't answer her phone, nor is there a voicemail or a machine available to leave a message.  She was sent home from the ER with nothing for nausea.  Per ER note patient was seen for left flank pain and frequent urination she states she had constant vomiting while in the ER, but per ER note there was no vomiting just nausea, she had a CT scan it showed gastritis.  Patient  was to be taking Prilosec prescribed by Dr Juanda Chance, but she never filled the rx.  I have asked her to go pick up rx, I will send her a phenergan refill rx'd by Dr Juanda Chance in February.  I will move up her appointment with Dr Juanda Chance for 08/08/09 10:00.  I spoke with the patient for over 20 minutes, she was at work and no vomiting episodes while I was on the phone with her.  Dr Arlyce Dice you are MD of the day.  I have copied in the ER note please advise. Initial call taken by: Darcey Nora RN, CGRN,  August 03, 2009 11:30 AM  Follow-up for Phone Call        I agree with recommendations.  If she's not improved instruct to revisit the ER Follow-up by: Louis Meckel MD,  August 03, 2009 12:17 PM  Additional Follow-up for Phone Call Additional follow up Details #1::        I attempted to reach the patient again, she again didn't answer the phone, nor is there a voicemail.  I will mail her a letter Additional Follow-up by: Darcey Nora RN, CGRN,  August 03, 2009 1:42 PM    Prescriptions: PROMETHAZINE HCL 25 MG TABS (PROMETHAZINE HCL) Take 1 tablet by mouth every 4-6 hours as needed for nausea  #20 x 0   Entered by:   Darcey Nora RN, CGRN   Authorized by:    Hart Carwin MD   Signed by:   Darcey Nora RN, CGRN on 08/03/2009   Method used:   [facility]ron[facility] to        [facility] [address] [phone]* (retail)       [address]       [place], [place]  [address]       Ph: [phone] or [phone]       Fax: [phone]   RxID:   [id number] PRILOSEC OTC 20 MG  TBEC (OMEPRAZOLE MAGNESIUM) Take 1 by mouth twice daily.  #60 x 1   Entered by:   Darcey Nora RN, CGRN   Authorized by:   Hart Carwin MD   Signed by:   Darcey Nora RN, CGRN on 08/03/2009   Method used:   [facility] to        [facility] [address] [phone]* (retail)       [address]       [place], [place]  [address]       Ph: [phone] or [phone]       Fax: [phone]   RxID:   [id number]

## 2010-02-07 NOTE — Progress Notes (Signed)
Summary: TRIAGE-LUQ/BACK PAIN  Phone Note Call from Patient Call back at Home Phone [phone]   Caller: Patient Call For: Kelly Rosales Reason for Call: Talk to Nurse Summary of Call: Patient wants  to let Dr Kelly Rosales know that she started to have left side pain that radiates to her back, It started on Friday night and it's getting worse. Initial call taken by: Tawni Levy,  May 16, 2009 3:28 PM  Follow-up for Phone Call        Pt. was in ER 04-14-09. Starting 05-13-09 she has constant LUQ pain that goes around to left side and back, is getting worse. Thinks she had a black stool on friday. Some nausea, bloating,  Denies constipation, diarrhea, blood, fever. Began Donnatal three times a day since Friday, not helping.   1) Soft,bland diet. No spicy,greasy,fried foods. 2) Continue Donnatal for now 3) tylenol/Ibuprofen as needed 4) If symptoms become worse call back immediately or go to ER. 5) I will call pt., if new orders, after MD reviews.  DR.Avrohom Mckelvin PLEASE ADVISE  Follow-up by: Laureen Ochs LPN,  May 17, 4130 3:44 PM  Additional Follow-up for Phone Call Additional follow up Details #1::        please start Prelosec 20 mg by mouth two times a day.#30, Additional Follow-up by: Hart Carwin MD,  May 16, 2009 9:57 PM    Additional Follow-up for Phone Call Additional follow up Details #2::    Message left for pt. with above MD instructions. Med to pt. pharmacy. Pt. instructed to call back as needed.  Follow-up by: Laureen Ochs LPN,  May 17, 2009 8:28 AM  New/Updated Medications: PRILOSEC OTC 20 MG  TBEC (OMEPRAZOLE MAGNESIUM) Take 1 by mouth twice daily. Prescriptions: PRILOSEC OTC 20 MG  TBEC (OMEPRAZOLE MAGNESIUM) Take 1 by mouth twice daily.  #60 x 2   Entered by:   Laureen Ochs LPN   Authorized by:   Hart Carwin MD   Signed by:   Laureen Ochs LPN on 44/01/270   Method used:   [facility]        [facility] [address] [phone]* (retail)       [address]       [place], [place]  [address]       Ph: [phone] or [phone]       Fax: [phone]   RxID:   [id number]

## 2010-02-07 NOTE — Progress Notes (Signed)
Summary: Returning Dr Regino Schultze call  Phone Note Call from Patient Call back at [phone]   Call For: Dr Juanda Chance Reason for Call: Talk to Doctor Summary of Call: Says Dr Juanda Chance called her and she is returning her call. Initial call taken by: Leanor Kail [facility],  April 15, 2009 10:32 AM  Follow-up for Phone Call        Pt. states Dr.Nathanel Tallman called her last week and asked her to call her back. Pt. wants to speak with Dr.Shareen Capwell about her ER visit this week.  DR.Rafaelita Foister PLEASE CALL PT. AT [phone]  Follow-up by: Laureen Ochs LPN,  April 15, 2009 11:15 AM  Additional Follow-up for Phone Call Additional follow up Details #1::        I have discussed the ER visit with the pt.She is feeling better tyday. All tests have been negative. No further tests planned. ? IBS. Will see as needed  Additional Follow-up by: Hart Carwin MD,  April 15, 2009 6:21 PM

## 2010-02-07 NOTE — Progress Notes (Signed)
Summary: Medication  Phone Note From Pharmacy   [facility] [address] [phone] Call For: Dr. Juanda Chance  Reason for Call: Medication not on formulary Summary of Call: Pt.'s ins. will not cover the Librax and it is too expensive. Initial call taken by: Karna Christmas,  February 25, 2009 11:35 AM  Follow-up for Phone Call        Dr Juanda Chance- Patient has now called back to say that librax will be too expensive for her....any cheaper alternative you would like her to try? Follow-up by: Hortense Ramal CMA [facility]),  February 25, 2009 11:39 AM  Additional Follow-up for Phone Call Additional follow up Details #1::        Donnatal #30 1 potid ac, 1 refill Additional Follow-up by: Hart Carwin MD,  February 25, 2009 8:25 PM    Additional Follow-up for Phone Call Additional follow up Details #2::    New prescription sent to pharmacy. Follow-up by: Hortense Ramal CMA [facility]ull),  February 28, 2009 8:33 AM  New/Updated Medications: DONNATAL  TABS (BELLADONNA ALK-PHENOBARBITAL) Take 1 tablet by mouth three times a day before meals. (pharmacy-please d/c librax prescription) Prescriptions: DONNATAL  TABS (BELLADONNA ALK-PHENOBARBITAL) Take 1 tablet by mouth three times a day before meals. (pharmacy-please d/c librax prescription)  #30 x 1   Entered by:   Hortense Ramal CMA (AAMA)   Authorized by:   Hart Carwin MD   Signed by:   Hortense Ramal CMA (AAMA) on 02/28/2009   Method used:   Printed then faxed to ...       [facility] [address] [phone][facility] (retail)       [address]       [place], [place]  [address]       Ph: [phone] or [phone]       Fax: [phone]   RxID:   [id number]

## 2010-02-07 NOTE — Progress Notes (Signed)
Summary: triage  Phone Note Call from Patient Call back at Work Phone [phone] Call back at [phone]   Caller: Patient Call For: Juanda Chance Reason for Call: Talk to Nurse Summary of Call: Patient has left side pain, nausea, and vomitting would like to know what to do until her appt 9-13. 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 Darcey Nora RN, [facility]  August 01, 2009 4:11 PM Follow-up by: Darcey Nora RN, CGRN,  August 01, 2009 4:11 PM  Additional Follow-up for Phone Call Additional follow up Details #1::        Unable to leave a message at either above #'s. Laureen Ochs LPN  August 02, 2009 8:35 AM  Attempted to reach the patient again at both numbers and unable to leave a message.  patient also tried to call answering service last night and Dr Marina Goodell was unable to get through to the patient.  Will await a return phone call from her.  Additional Follow-up by: Darcey Nora RN, CGRN,  August 02, 2009 10:49 AM

## 2010-02-07 NOTE — Letter (Signed)
Summary: [facility]  [facility]   Imported By: Sherian Rein 02/21/2009 08:28:04  _____________________________________________________________________  External Attachment:    Type:   Image     Comment:   External Document

## 2010-03-08 ENCOUNTER — Ambulatory Visit: Payer: Medicaid Other | Attending: Sports Medicine | Admitting: Physical Therapy

## 2010-03-25 LAB — CBC
Hemoglobin: 12.9 g/dL (ref 12.0–15.0)
RBC: 4.58 MIL/uL (ref 3.87–5.11)

## 2010-03-25 LAB — DIFFERENTIAL
Basophils Absolute: 0 10*3/uL (ref 0.0–0.1)
Eosinophils Absolute: 0.1 10*3/uL (ref 0.0–0.7)
Lymphs Abs: 1.7 10*3/uL (ref 0.7–4.0)
Neutrophils Relative %: 69 % (ref 43–77)

## 2010-03-25 LAB — URINALYSIS, ROUTINE W REFLEX MICROSCOPIC
Hgb urine dipstick: NEGATIVE
Nitrite: NEGATIVE
Specific Gravity, Urine: 1.02 (ref 1.005–1.030)
Urobilinogen, UA: 0.2 mg/dL (ref 0.0–1.0)
pH: 7.5 (ref 5.0–8.0)

## 2010-03-25 LAB — BASIC METABOLIC PANEL
Calcium: 8.9 mg/dL (ref 8.4–10.5)
GFR calc Af Amer: >60 mL/min (ref >60)
GFR calc non Af Amer: >60 mL/min (ref >60)
Potassium: 3.8 mEq/L (ref 3.5–5.1)
Sodium: 139 mEq/L (ref 135–145)

## 2010-03-26 LAB — CBC
MCV: 84.1 fL (ref 78.0–100.0)
Platelets: 265 10*3/uL (ref 150–400)
RBC: 4.74 MIL/uL (ref 3.87–5.11)
WBC: 7.6 10*3/uL (ref 4.0–10.5)

## 2010-03-26 LAB — DIFFERENTIAL
Basophils Absolute: 0 10*3/uL (ref 0.0–0.1)
Basophils Relative: 0 % (ref 0–1)
Eosinophils Relative: 1 % (ref 0–5)
Monocytes Absolute: 0.5 10*3/uL (ref 0.1–1.0)
Monocytes Relative: 7 % (ref 3–12)

## 2010-03-26 LAB — COMPREHENSIVE METABOLIC PANEL
ALT: 15 U/L (ref 0–35)
AST: 21 U/L (ref 0–37)
Albumin: 3.6 g/dL (ref 3.5–5.2)
Alkaline Phosphatase: 73 U/L (ref 39–117)
BUN: 11 mg/dL (ref 6–23)
Chloride: 106 mEq/L (ref 96–112)
GFR calc Af Amer: >60 mL/min (ref >60)
Potassium: 3.7 mEq/L (ref 3.5–5.1)
Sodium: 140 mEq/L (ref 135–145)
Total Bilirubin: 0.5 mg/dL (ref 0.3–1.2)

## 2010-03-29 LAB — BASIC METABOLIC PANEL
CO2: 29 mEq/L (ref 19–32)
GFR calc Af Amer: >60 mL/min (ref >60)
GFR calc non Af Amer: 52 mL/min — ABNORMAL LOW (ref >60)
Glucose, Bld: 104 mg/dL — ABNORMAL HIGH (ref 70–99)
Potassium: 3.7 mEq/L (ref 3.5–5.1)
Sodium: 139 mEq/L (ref 135–145)

## 2010-03-29 LAB — CBC
HCT: 39.8 % (ref 36.0–46.0)
Hemoglobin: 12.8 g/dL (ref 12.0–15.0)
MCHC: 32.1 g/dL (ref 30.0–36.0)
RBC: 4.65 MIL/uL (ref 3.87–5.11)
RDW: 13.2 % (ref 11.5–15.5)

## 2010-03-29 LAB — DIFFERENTIAL
Basophils Absolute: 0.1 10*3/uL (ref 0.0–0.1)
Eosinophils Relative: 2 % (ref 0–5)
Lymphocytes Relative: 34 % (ref 12–46)
Monocytes Absolute: 0.7 10*3/uL (ref 0.1–1.0)
Monocytes Relative: 8 % (ref 3–12)
Neutro Abs: 4.9 10*3/uL (ref 1.7–7.7)

## 2010-03-29 LAB — URINE MICROSCOPIC-ADD ON

## 2010-03-29 LAB — URINALYSIS, ROUTINE W REFLEX MICROSCOPIC
Bilirubin Urine: NEGATIVE
Hgb urine dipstick: NEGATIVE
Ketones, ur: NEGATIVE mg/dL
Nitrite: NEGATIVE
Specific Gravity, Urine: 1.01 (ref 1.005–1.030)
pH: 7 (ref 5.0–8.0)

## 2010-03-29 LAB — POCT CARDIAC MARKERS
CKMB, poc: 1.2 ng/mL (ref 1.0–8.0)
Myoglobin, poc: 53.4 ng/mL (ref 12–200)
Troponin i, poc: <0.05 ng/mL (ref 0.00–0.09)

## 2010-04-12 LAB — GLUCOSE, CAPILLARY: Glucose-Capillary: 109 mg/dL — ABNORMAL HIGH (ref 70–99)

## 2010-04-12 LAB — COMPREHENSIVE METABOLIC PANEL WITH GFR
AST: 22 U/L (ref 0–37)
BUN: 14 mg/dL (ref 6–23)
CO2: 29 meq/L (ref 19–32)
Chloride: 107 meq/L (ref 96–112)
Creatinine, Ser: 1 mg/dL (ref 0.4–1.2)
GFR calc non Af Amer: 57 mL/min — ABNORMAL LOW (ref >60)
Total Bilirubin: 0.3 mg/dL (ref 0.3–1.2)

## 2010-04-12 LAB — COMPREHENSIVE METABOLIC PANEL
ALT: 20 U/L (ref 0–35)
Albumin: 3.5 g/dL (ref 3.5–5.2)
Alkaline Phosphatase: 77 U/L (ref 39–117)
Calcium: 9.4 mg/dL (ref 8.4–10.5)
GFR calc Af Amer: >60 mL/min (ref >60)
Glucose, Bld: 93 mg/dL (ref 70–99)
Potassium: 3.9 mEq/L (ref 3.5–5.1)
Sodium: 140 mEq/L (ref 135–145)
Total Protein: 7.7 g/dL (ref 6.0–8.3)

## 2010-04-12 LAB — CBC
HCT: 40.5 % (ref 36.0–46.0)
Hemoglobin: 13.3 g/dL (ref 12.0–15.0)
MCHC: 32.8 g/dL (ref 30.0–36.0)
MCV: 84.7 fL (ref 78.0–100.0)
Platelets: 294 10*3/uL (ref 150–400)
RBC: 4.79 MIL/uL (ref 3.87–5.11)
RDW: 13.5 % (ref 11.5–15.5)
WBC: 7.9 K/uL (ref 4.0–10.5)

## 2010-04-12 LAB — DIFFERENTIAL
Basophils Absolute: 0 K/uL (ref 0.0–0.1)
Basophils Relative: 1 % (ref 0–1)
Eosinophils Absolute: 0.1 10*3/uL (ref 0.0–0.7)
Eosinophils Relative: 1 % (ref 0–5)
Lymphocytes Relative: 24 % (ref 12–46)
Lymphs Abs: 1.9 10*3/uL (ref 0.7–4.0)
Monocytes Absolute: 0.5 10*3/uL (ref 0.1–1.0)
Monocytes Relative: 7 % (ref 3–12)
Neutro Abs: 5.4 K/uL (ref 1.7–7.7)
Neutrophils Relative %: 68 % (ref 43–77)

## 2010-04-12 LAB — POCT CARDIAC MARKERS
Myoglobin, poc: 128 ng/mL (ref 12–200)
Troponin i, poc: <0.05 ng/mL (ref 0.00–0.09)

## 2010-04-12 LAB — LIPASE, BLOOD: Lipase: 22 U/L (ref 11–59)

## 2010-04-15 LAB — POCT CARDIAC MARKERS
CKMB, poc: <1 ng/mL — ABNORMAL LOW (ref 1.0–8.0)
Myoglobin, poc: 39.1 ng/mL (ref 12–200)
Troponin i, poc: <0.05 ng/mL (ref 0.00–0.09)

## 2010-04-15 LAB — DIFFERENTIAL
Lymphocytes Relative: 29 % (ref 12–46)
Lymphs Abs: 2.4 10*3/uL (ref 0.7–4.0)
Monocytes Relative: 9 % (ref 3–12)
Neutrophils Relative %: 60 % (ref 43–77)

## 2010-04-15 LAB — CBC
HCT: 40.3 % (ref 36.0–46.0)
Platelets: 306 10*3/uL (ref 150–400)
RBC: 4.8 MIL/uL (ref 3.87–5.11)
WBC: 8.1 10*3/uL (ref 4.0–10.5)

## 2010-04-15 LAB — POCT I-STAT, CHEM 8
BUN: 14 mg/dL (ref 6–23)
Chloride: 106 mEq/L (ref 96–112)
Creatinine, Ser: 0.9 mg/dL (ref 0.4–1.2)
Sodium: 143 mEq/L (ref 135–145)

## 2010-04-15 LAB — URINALYSIS, ROUTINE W REFLEX MICROSCOPIC
Bilirubin Urine: NEGATIVE
Glucose, UA: NEGATIVE mg/dL
Hgb urine dipstick: NEGATIVE
Ketones, ur: NEGATIVE mg/dL
pH: 7.5 (ref 5.0–8.0)

## 2010-04-15 LAB — TSH: TSH: 1.831 u[IU]/mL (ref 0.350–4.500)

## 2010-04-18 LAB — URINALYSIS, ROUTINE W REFLEX MICROSCOPIC
Glucose, UA: NEGATIVE mg/dL
Ketones, ur: 15 mg/dL — AB
Protein, ur: NEGATIVE mg/dL
Urobilinogen, UA: 4 mg/dL — ABNORMAL HIGH (ref 0.0–1.0)

## 2010-04-18 LAB — DIFFERENTIAL
Basophils Absolute: 0.1 10*3/uL (ref 0.0–0.1)
Lymphocytes Relative: 32 % (ref 12–46)
Neutro Abs: 4.8 10*3/uL (ref 1.7–7.7)

## 2010-04-18 LAB — URINE CULTURE: Culture: NO GROWTH

## 2010-04-18 LAB — RAPID URINE DRUG SCREEN, HOSP PERFORMED
Amphetamines: NOT DETECTED
Barbiturates: NOT DETECTED
Benzodiazepines: NOT DETECTED
Cocaine: NOT DETECTED
Opiates: NOT DETECTED

## 2010-04-18 LAB — CBC
Hemoglobin: 12.4 g/dL (ref 12.0–15.0)
Platelets: 303 10*3/uL (ref 150–400)
RDW: 13.7 % (ref 11.5–15.5)
WBC: 8.6 10*3/uL (ref 4.0–10.5)

## 2010-04-18 LAB — POCT I-STAT, CHEM 8
BUN: 15 mg/dL (ref 6–23)
Chloride: 105 mEq/L (ref 96–112)
Potassium: 3.9 mEq/L (ref 3.5–5.1)
Sodium: 141 mEq/L (ref 135–145)

## 2010-04-24 ENCOUNTER — Emergency Department (HOSPITAL_COMMUNITY): Payer: Self-pay

## 2010-04-24 ENCOUNTER — Emergency Department (HOSPITAL_COMMUNITY)
Admission: EM | Admit: 2010-04-24 | Discharge: 2010-04-25 | Disposition: A | Discharge status: Home / Self Care | Payer: Self-pay | Attending: Emergency Medicine | Admitting: Emergency Medicine

## 2010-04-24 DIAGNOSIS — M25579 Pain in unspecified ankle and joints of unspecified foot: Secondary | ICD-10-CM | POA: Insufficient documentation

## 2010-04-24 DIAGNOSIS — X500XXA Overexertion from strenuous movement or load, initial encounter: Secondary | ICD-10-CM | POA: Insufficient documentation

## 2010-04-24 DIAGNOSIS — S93409A Sprain of unspecified ligament of unspecified ankle, initial encounter: Secondary | ICD-10-CM | POA: Insufficient documentation

## 2010-06-13 ENCOUNTER — Emergency Department (HOSPITAL_COMMUNITY)
Admission: EM | Admit: 2010-06-13 | Discharge: 2010-06-13 | Discharge status: Against Medical Advice | Payer: Self-pay | Attending: Emergency Medicine | Admitting: Emergency Medicine

## 2010-06-20 ENCOUNTER — Emergency Department (HOSPITAL_COMMUNITY)
Admission: EM | Admit: 2010-06-20 | Discharge: 2010-06-20 | Disposition: A | Discharge status: Home / Self Care | Payer: Self-pay | Attending: Emergency Medicine | Admitting: Emergency Medicine

## 2010-06-20 ENCOUNTER — Emergency Department (HOSPITAL_COMMUNITY): Payer: Self-pay

## 2010-06-20 DIAGNOSIS — W230XXA Caught, crushed, jammed, or pinched between moving objects, initial encounter: Secondary | ICD-10-CM | POA: Insufficient documentation

## 2010-06-20 DIAGNOSIS — S60229A Contusion of unspecified hand, initial encounter: Secondary | ICD-10-CM | POA: Insufficient documentation

## 2010-09-26 ENCOUNTER — Emergency Department (HOSPITAL_COMMUNITY)
Admission: EM | Admit: 2010-09-26 | Discharge: 2010-09-27 | Disposition: A | Discharge status: Home / Self Care | Payer: Self-pay | Attending: Emergency Medicine | Admitting: Emergency Medicine

## 2010-09-26 DIAGNOSIS — M545 Low back pain, unspecified: Secondary | ICD-10-CM | POA: Insufficient documentation

## 2010-09-26 DIAGNOSIS — R079 Chest pain, unspecified: Secondary | ICD-10-CM | POA: Insufficient documentation

## 2010-09-26 DIAGNOSIS — J329 Chronic sinusitis, unspecified: Secondary | ICD-10-CM | POA: Insufficient documentation

## 2010-09-27 ENCOUNTER — Emergency Department (HOSPITAL_COMMUNITY): Payer: Self-pay

## 2010-09-27 LAB — POCT I-STAT, CHEM 8
Calcium, Ion: 1.21 mmol/L (ref 1.12–1.32)
Chloride: 104 mEq/L (ref 96–112)
Glucose, Bld: 108 mg/dL — ABNORMAL HIGH (ref 70–99)
HCT: 42 % (ref 36.0–46.0)
Hemoglobin: 14.3 g/dL (ref 12.0–15.0)

## 2010-09-27 LAB — URINALYSIS, ROUTINE W REFLEX MICROSCOPIC
Glucose, UA: NEGATIVE mg/dL
Hgb urine dipstick: NEGATIVE
Protein, ur: NEGATIVE mg/dL

## 2010-09-27 LAB — URINE MICROSCOPIC-ADD ON

## 2010-10-09 DIAGNOSIS — M21969 Unspecified acquired deformity of unspecified lower leg: Secondary | ICD-10-CM

## 2010-10-09 DIAGNOSIS — M7660 Achilles tendinitis, unspecified leg: Secondary | ICD-10-CM

## 2010-10-09 DIAGNOSIS — M5416 Radiculopathy, lumbar region: Secondary | ICD-10-CM

## 2010-10-09 DIAGNOSIS — G629 Polyneuropathy, unspecified: Secondary | ICD-10-CM

## 2010-10-09 HISTORY — DX: Achilles tendinitis, unspecified leg: M76.60

## 2010-10-09 HISTORY — DX: Polyneuropathy, unspecified: G62.9

## 2010-10-09 HISTORY — DX: Radiculopathy, lumbar region: M54.16

## 2010-10-09 HISTORY — DX: Unspecified acquired deformity of unspecified lower leg: M21.969

## 2010-10-11 ENCOUNTER — Other Ambulatory Visit: Payer: Self-pay | Admitting: Internal Medicine

## 2010-10-11 DIAGNOSIS — M545 Low back pain, unspecified: Secondary | ICD-10-CM

## 2010-10-14 ENCOUNTER — Ambulatory Visit
Admission: RE | Admit: 2010-10-14 | Discharge: 2010-10-14 | Disposition: A | Discharge status: Home / Self Care | Payer: Self-pay | Source: Ambulatory Visit | Attending: Internal Medicine | Admitting: Internal Medicine

## 2010-10-14 DIAGNOSIS — M545 Low back pain, unspecified: Secondary | ICD-10-CM

## 2010-10-16 LAB — RAPID STREP SCREEN (MED CTR MEBANE ONLY): Streptococcus, Group A Screen (Direct): NEGATIVE

## 2010-11-03 ENCOUNTER — Other Ambulatory Visit: Payer: Self-pay | Admitting: Internal Medicine

## 2010-11-03 ENCOUNTER — Ambulatory Visit
Admission: RE | Admit: 2010-11-03 | Discharge: 2010-11-03 | Disposition: A | Discharge status: Home / Self Care | Payer: Medicaid Other | Source: Ambulatory Visit | Attending: Internal Medicine | Admitting: Internal Medicine

## 2010-11-03 DIAGNOSIS — R52 Pain, unspecified: Secondary | ICD-10-CM

## 2011-01-29 ENCOUNTER — Ambulatory Visit: Payer: Medicaid Other | Attending: Neurosurgery | Admitting: Physical Therapy

## 2011-02-15 ENCOUNTER — Ambulatory Visit: Payer: Medicaid Other | Attending: Neurosurgery | Admitting: Rehabilitation

## 2011-02-15 DIAGNOSIS — R262 Difficulty in walking, not elsewhere classified: Secondary | ICD-10-CM | POA: Insufficient documentation

## 2011-02-15 DIAGNOSIS — IMO0001 Reserved for inherently not codable concepts without codable children: Secondary | ICD-10-CM | POA: Insufficient documentation

## 2011-02-15 DIAGNOSIS — M545 Low back pain, unspecified: Secondary | ICD-10-CM | POA: Insufficient documentation

## 2011-02-27 ENCOUNTER — Ambulatory Visit: Payer: Medicaid Other | Admitting: Rehabilitation

## 2011-03-01 ENCOUNTER — Ambulatory Visit: Payer: Medicaid Other | Admitting: Rehabilitation

## 2011-03-06 ENCOUNTER — Ambulatory Visit: Payer: Medicaid Other | Admitting: Rehabilitation

## 2011-03-07 ENCOUNTER — Ambulatory Visit: Payer: Medicaid Other | Admitting: Rehabilitation

## 2011-03-13 ENCOUNTER — Ambulatory Visit: Payer: Medicaid Other | Attending: Rehabilitation | Admitting: Rehabilitation

## 2011-03-13 DIAGNOSIS — IMO0001 Reserved for inherently not codable concepts without codable children: Secondary | ICD-10-CM | POA: Insufficient documentation

## 2011-03-13 DIAGNOSIS — R262 Difficulty in walking, not elsewhere classified: Secondary | ICD-10-CM | POA: Insufficient documentation

## 2011-03-13 DIAGNOSIS — M545 Low back pain, unspecified: Secondary | ICD-10-CM | POA: Insufficient documentation

## 2011-03-15 ENCOUNTER — Encounter: Payer: Medicaid Other | Admitting: Rehabilitation

## 2011-03-20 ENCOUNTER — Ambulatory Visit: Payer: Medicaid Other | Admitting: Rehabilitation

## 2011-03-22 ENCOUNTER — Ambulatory Visit: Payer: Medicaid Other | Admitting: Rehabilitation

## 2011-03-27 ENCOUNTER — Ambulatory Visit: Payer: Medicaid Other | Admitting: Rehabilitation

## 2011-04-03 ENCOUNTER — Ambulatory Visit: Payer: Medicaid Other | Admitting: Rehabilitation

## 2011-04-05 ENCOUNTER — Ambulatory Visit: Payer: Medicaid Other | Admitting: Rehabilitation

## 2011-04-12 ENCOUNTER — Ambulatory Visit: Payer: Medicaid Other | Attending: Rehabilitation | Admitting: Rehabilitation

## 2011-04-12 DIAGNOSIS — IMO0001 Reserved for inherently not codable concepts without codable children: Secondary | ICD-10-CM | POA: Insufficient documentation

## 2011-04-12 DIAGNOSIS — R262 Difficulty in walking, not elsewhere classified: Secondary | ICD-10-CM | POA: Insufficient documentation

## 2011-04-12 DIAGNOSIS — M545 Low back pain, unspecified: Secondary | ICD-10-CM | POA: Insufficient documentation

## 2011-04-17 ENCOUNTER — Ambulatory Visit: Payer: Medicaid Other | Admitting: Rehabilitation

## 2011-04-19 ENCOUNTER — Ambulatory Visit: Payer: Medicaid Other | Admitting: Rehabilitation

## 2011-04-24 ENCOUNTER — Encounter: Payer: Medicaid Other | Admitting: Rehabilitation

## 2011-04-26 ENCOUNTER — Encounter: Payer: Medicaid Other | Admitting: Rehabilitation

## 2011-08-09 ENCOUNTER — Encounter (HOSPITAL_BASED_OUTPATIENT_CLINIC_OR_DEPARTMENT_OTHER): Payer: Self-pay | Admitting: *Deleted

## 2011-08-09 ENCOUNTER — Emergency Department (HOSPITAL_BASED_OUTPATIENT_CLINIC_OR_DEPARTMENT_OTHER): Payer: Self-pay

## 2011-08-09 ENCOUNTER — Emergency Department (HOSPITAL_BASED_OUTPATIENT_CLINIC_OR_DEPARTMENT_OTHER)
Admission: EM | Admit: 2011-08-09 | Discharge: 2011-08-09 | Disposition: A | Discharge status: Home / Self Care | Payer: Self-pay | Attending: Emergency Medicine | Admitting: Emergency Medicine

## 2011-08-09 DIAGNOSIS — M549 Dorsalgia, unspecified: Secondary | ICD-10-CM | POA: Insufficient documentation

## 2011-08-09 DIAGNOSIS — M5136 Other intervertebral disc degeneration, lumbar region: Secondary | ICD-10-CM

## 2011-08-09 MED ORDER — OXYCODONE-ACETAMINOPHEN 5-325 MG PO TABS: 1.0000 | ORAL_TABLET | Freq: Four times a day (QID) | ORAL | Status: AC | PRN

## 2011-08-09 MED ORDER — KETOROLAC TROMETHAMINE 60 MG/2ML IM SOLN
60.0000 mg | Freq: Once | INTRAMUSCULAR | Status: AC
  Administered 2011-08-09: 60 mg via INTRAMUSCULAR
  Filled 2011-08-09: qty 2

## 2011-08-09 MED ORDER — METHOCARBAMOL 750 MG PO TABS: 750.0000 mg | ORAL_TABLET | Freq: Two times a day (BID) | ORAL | Status: AC

## 2011-08-09 MED ORDER — OXYCODONE-ACETAMINOPHEN 5-325 MG PO TABS
1.0000 | ORAL_TABLET | Freq: Once | ORAL | Status: AC
  Administered 2011-08-09: 1 via ORAL
  Filled 2011-08-09 (×2): qty 1

## 2011-08-09 MED ORDER — METHOCARBAMOL 500 MG PO TABS
1000.0000 mg | ORAL_TABLET | Freq: Once | ORAL | Status: AC
  Administered 2011-08-09: 1000 mg via ORAL
  Filled 2011-08-09: qty 2

## 2011-08-09 MED ORDER — DEXAMETHASONE SODIUM PHOSPHATE 10 MG/ML IJ SOLN
10.0000 mg | Freq: Once | INTRAMUSCULAR | Status: AC
  Administered 2011-08-09: 10 mg via INTRAMUSCULAR
  Filled 2011-08-09: qty 1

## 2011-08-09 NOTE — ED Notes (Signed)
Pt ambulated to room with a steady gait

## 2011-08-09 NOTE — ED Notes (Signed)
MD at bedside. 

## 2011-08-09 NOTE — ED Notes (Addendum)
C/o low back pain for few days. Pt denies injury or urinary sxs. Pt states that she has been treating the pain with oxycodone but she ran out 2 days ago, needs more pain meds.

## 2011-08-09 NOTE — ED Notes (Signed)
Patient transported to X-ray 

## 2011-08-09 NOTE — ED Provider Notes (Signed)
History     CSN: [id number]  Arrival date & time 08/09/11  0236   First MD Initiated Contact with Patient 08/09/11 [phone]      Chief Complaint  Patient presents with  . Back Pain    (Consider location/radiation/quality/duration/timing/severity/associated sxs/prior treatment) Patient is a 61 y.o. female presenting with back pain. The history is provided by the patient. No language interpreter was used.  Back Pain  This is a recurrent problem. The current episode started more than 2 days ago. The problem occurs constantly. The problem has not changed since onset.The pain is associated with no known injury. The pain is present in the lumbar spine. The quality of the pain is described as stabbing. The pain does not radiate. The pain is at a severity of 10/10. The pain is severe. The symptoms are aggravated by bending. The pain is the same all the time. Stiffness is present all day. Pertinent negatives include no chest pain, no fever, no numbness, no weight loss, no headaches, no abdominal pain, no abdominal swelling, no bowel incontinence, no perianal numbness, no bladder incontinence, no dysuria, no pelvic pain, no leg pain, no paresthesias, no paresis, no tingling and no weakness. She has tried nothing for the symptoms. The treatment provided no relief. Risk factors include obesity.  Pain started when she ran out of her percocet  Past Medical History  Diagnosis Date  . Back pain     History reviewed. No pertinent past surgical history.  History reviewed. No pertinent family history.  History  Substance Use Topics  . Smoking status: Not on file  . Smokeless tobacco: Not on file  . Alcohol Use: No    OB History    Grav Para Term Preterm Abortions TAB SAB Ect Mult Living                  Review of Systems  Constitutional: Negative for fever and weight loss.  Cardiovascular: Negative for chest pain.  Gastrointestinal: Negative for abdominal pain and bowel incontinence.    Genitourinary: Negative for bladder incontinence, dysuria and pelvic pain.  Musculoskeletal: Positive for back pain.  Neurological: Negative for tingling, weakness, numbness, headaches and paresthesias.  All other systems reviewed and are negative.    Allergies  Penicillins  Home Medications  No current outpatient prescriptions on file.  BP 117/84  Pulse 86  Temp 98.4 F (36.9 C) (Oral)  Resp 18  Ht 5\' 6"  (1.676 m)  Wt 200 lb (90.719 kg)  BMI 32.28 kg/m2  SpO2 98%  Physical Exam  Constitutional: She is oriented to person, place, and time. She appears well-developed and well-nourished.  HENT:  Head: Normocephalic and atraumatic.  Mouth/Throat: Oropharynx is clear and moist.  Eyes: Conjunctivae are normal. Pupils are equal, round, and reactive to light.  Neck: Normal range of motion. Neck supple.  Cardiovascular: Normal rate and regular rhythm.   Pulmonary/Chest: Effort normal and breath sounds normal. She has no wheezes. She has no rales.  Abdominal: Soft. Bowel sounds are normal. There is no tenderness. There is no rebound and no guarding.  Musculoskeletal: Normal range of motion.  Neurological: She is alert and oriented to person, place, and time. She has normal reflexes. She displays no atrophy. No cranial nerve deficit or sensory deficit. She exhibits normal muscle tone.       Intact L5/s1 intact perineal sensation  Skin: Skin is warm and dry.  Psychiatric: She has a normal mood and affect.    ED Course  Procedures (  including critical care time)  Labs Reviewed - No data to display No results found.   No diagnosis found.    MDM  Follow up with your neurologist for ongoing care.  Return for weakness numbness leakage of urine or stool or any concerns.  Patient verbalizes understanding and agrees to follow up       Leidy Massar Smitty Cords, MD 08/09/11 1610

## 2011-09-07 ENCOUNTER — Encounter (HOSPITAL_BASED_OUTPATIENT_CLINIC_OR_DEPARTMENT_OTHER): Payer: Self-pay | Admitting: Emergency Medicine

## 2011-09-07 ENCOUNTER — Emergency Department (HOSPITAL_BASED_OUTPATIENT_CLINIC_OR_DEPARTMENT_OTHER): Payer: Medicaid Other

## 2011-09-07 ENCOUNTER — Emergency Department (HOSPITAL_BASED_OUTPATIENT_CLINIC_OR_DEPARTMENT_OTHER)
Admission: EM | Admit: 2011-09-07 | Discharge: 2011-09-07 | Disposition: A | Discharge status: Home / Self Care | Payer: Medicaid Other | Attending: Emergency Medicine | Admitting: Emergency Medicine

## 2011-09-07 DIAGNOSIS — M545 Low back pain, unspecified: Secondary | ICD-10-CM | POA: Insufficient documentation

## 2011-09-07 DIAGNOSIS — M542 Cervicalgia: Secondary | ICD-10-CM | POA: Insufficient documentation

## 2011-09-07 MED ORDER — HYDROCODONE-ACETAMINOPHEN 5-500 MG PO TABS: 1.0000 | ORAL_TABLET | Freq: Four times a day (QID) | ORAL | Status: AC | PRN

## 2011-09-07 NOTE — ED Provider Notes (Signed)
History     CSN: [id number]  Arrival date & time 09/07/11  1137   First MD Initiated Contact with Patient 09/07/11 1227      Chief Complaint  Patient presents with  . Back Pain    (Consider location/radiation/quality/duration/timing/severity/associated sxs/prior treatment) HPI Comments: Patient seen here recently for back pain.  She was given medications but is not feeling better.  She presents today demanding an mri.  She denies any bowel or bladder complaints.    Patient is a 61 y.o. female presenting with back pain. The history is provided by the patient.  Back Pain  This is a new problem. Episode onset: several weeks ago. The problem occurs constantly. The problem has not changed since onset.The pain is associated with no known injury. The pain is present in the lumbar spine (neck). The pain is moderate. The symptoms are aggravated by bending, twisting and certain positions. The pain is the same all the time. Pertinent negatives include no bowel incontinence, no bladder incontinence, no tingling and no weakness. She has tried NSAIDs for the symptoms. The treatment provided no relief.    Past Medical History  Diagnosis Date  . Back pain     History reviewed. No pertinent past surgical history.  No family history on file.  History  Substance Use Topics  . Smoking status: Not on file  . Smokeless tobacco: Not on file  . Alcohol Use: No    OB History    Grav Para Term Preterm Abortions TAB SAB Ect Mult Living                  Review of Systems  Gastrointestinal: Negative for bowel incontinence.  Genitourinary: Negative for bladder incontinence.  Musculoskeletal: Positive for back pain.  Neurological: Negative for tingling and weakness.  All other systems reviewed and are negative.    Allergies  Penicillins  Home Medications  No current outpatient prescriptions on file.  BP 126/78  Pulse 72  Temp 99.1 F (37.3 C) (Oral)  Ht 5\' 7"  (1.702 m)  Wt 195 lb  (88.451 kg)  BMI 30.54 kg/m2  SpO2 100%  Physical Exam  Nursing note and vitals reviewed. Constitutional: She is oriented to person, place, and time. She appears well-developed and well-nourished.  HENT:  Head: Normocephalic and atraumatic.  Neck: Normal range of motion. Neck supple.  Musculoskeletal: Normal range of motion.  Neurological: She is alert and oriented to person, place, and time.       DTR's are trace and equal in the ble.  Strength is 5/5 in the ble.  Ambulatory without difficulty.  Skin: Skin is warm and dry.    ED Course  Procedures (including critical care time)  Labs Reviewed - No data to display No results found.   No diagnosis found.    MDM  The patient presents with low back and neck pain that are musculoskeletal in nature.  The reflexes, strength are equal and there are no bowel or bladder issues.  She is demanding an mri be performed today, however I see no indication for am emergent mri and attempted to explain this to her.  I informed her I would prescribe anti-inflammatories and pain meds, but a non-emergent mri would have to be scheduled through her pcp when they felt the timing was appropriate.  This was unacceptable to her.  She told me I did nothing to help and that I just wanted to push her out the door.  She then stated that I  was "discriminatory" against her.   I ordered xrays of the cervical and lumbar spine which were unremarkable with the exception of degenerative changes in the cervical spine.  She will be discharged to home.  She was offered pain medication.  She initially refused, then agreed.          Geoffery Lyons, MD 09/10/11 [phone]

## 2011-09-07 NOTE — ED Notes (Signed)
Patient has positive straight leg raises bilaterally.  Unable to stand on toes and unable to rock back on heels.

## 2011-09-07 NOTE — ED Notes (Signed)
States her back has been "locking up" for several weeks and wants an MRI

## 2011-11-12 ENCOUNTER — Other Ambulatory Visit: Payer: Self-pay | Admitting: Internal Medicine

## 2011-11-12 DIAGNOSIS — Z1231 Encounter for screening mammogram for malignant neoplasm of breast: Secondary | ICD-10-CM

## 2011-12-13 ENCOUNTER — Ambulatory Visit
Admission: RE | Admit: 2011-12-13 | Discharge: 2011-12-13 | Disposition: A | Discharge status: Home / Self Care | Payer: Medicaid Other | Source: Ambulatory Visit | Attending: Internal Medicine | Admitting: Internal Medicine

## 2011-12-13 DIAGNOSIS — Z1231 Encounter for screening mammogram for malignant neoplasm of breast: Secondary | ICD-10-CM

## 2011-12-26 ENCOUNTER — Other Ambulatory Visit: Payer: Self-pay | Admitting: Internal Medicine

## 2011-12-26 DIAGNOSIS — R928 Other abnormal and inconclusive findings on diagnostic imaging of breast: Secondary | ICD-10-CM

## 2011-12-27 ENCOUNTER — Other Ambulatory Visit: Payer: Self-pay | Admitting: Internal Medicine

## 2011-12-27 ENCOUNTER — Inpatient Hospital Stay: Admission: RE | Admit: 2011-12-27 | Payer: Medicaid Other | Source: Ambulatory Visit

## 2011-12-27 DIAGNOSIS — Z1231 Encounter for screening mammogram for malignant neoplasm of breast: Secondary | ICD-10-CM

## 2011-12-27 DIAGNOSIS — R928 Other abnormal and inconclusive findings on diagnostic imaging of breast: Secondary | ICD-10-CM

## 2011-12-28 ENCOUNTER — Ambulatory Visit
Admission: RE | Admit: 2011-12-28 | Discharge: 2011-12-28 | Disposition: A | Discharge status: Home / Self Care | Payer: Medicaid Other | Source: Ambulatory Visit | Attending: Internal Medicine | Admitting: Internal Medicine

## 2011-12-28 DIAGNOSIS — R928 Other abnormal and inconclusive findings on diagnostic imaging of breast: Secondary | ICD-10-CM

## 2012-01-22 IMAGING — CR DG CHEST 2V
2 series · 2 of 2 positions shown · non-contrast
Comparison: 09/27/2010

CLINICAL DATA: Chest pain, shortness of breath

CHEST - 2 VIEW

[view not recorded (1 of 2)]
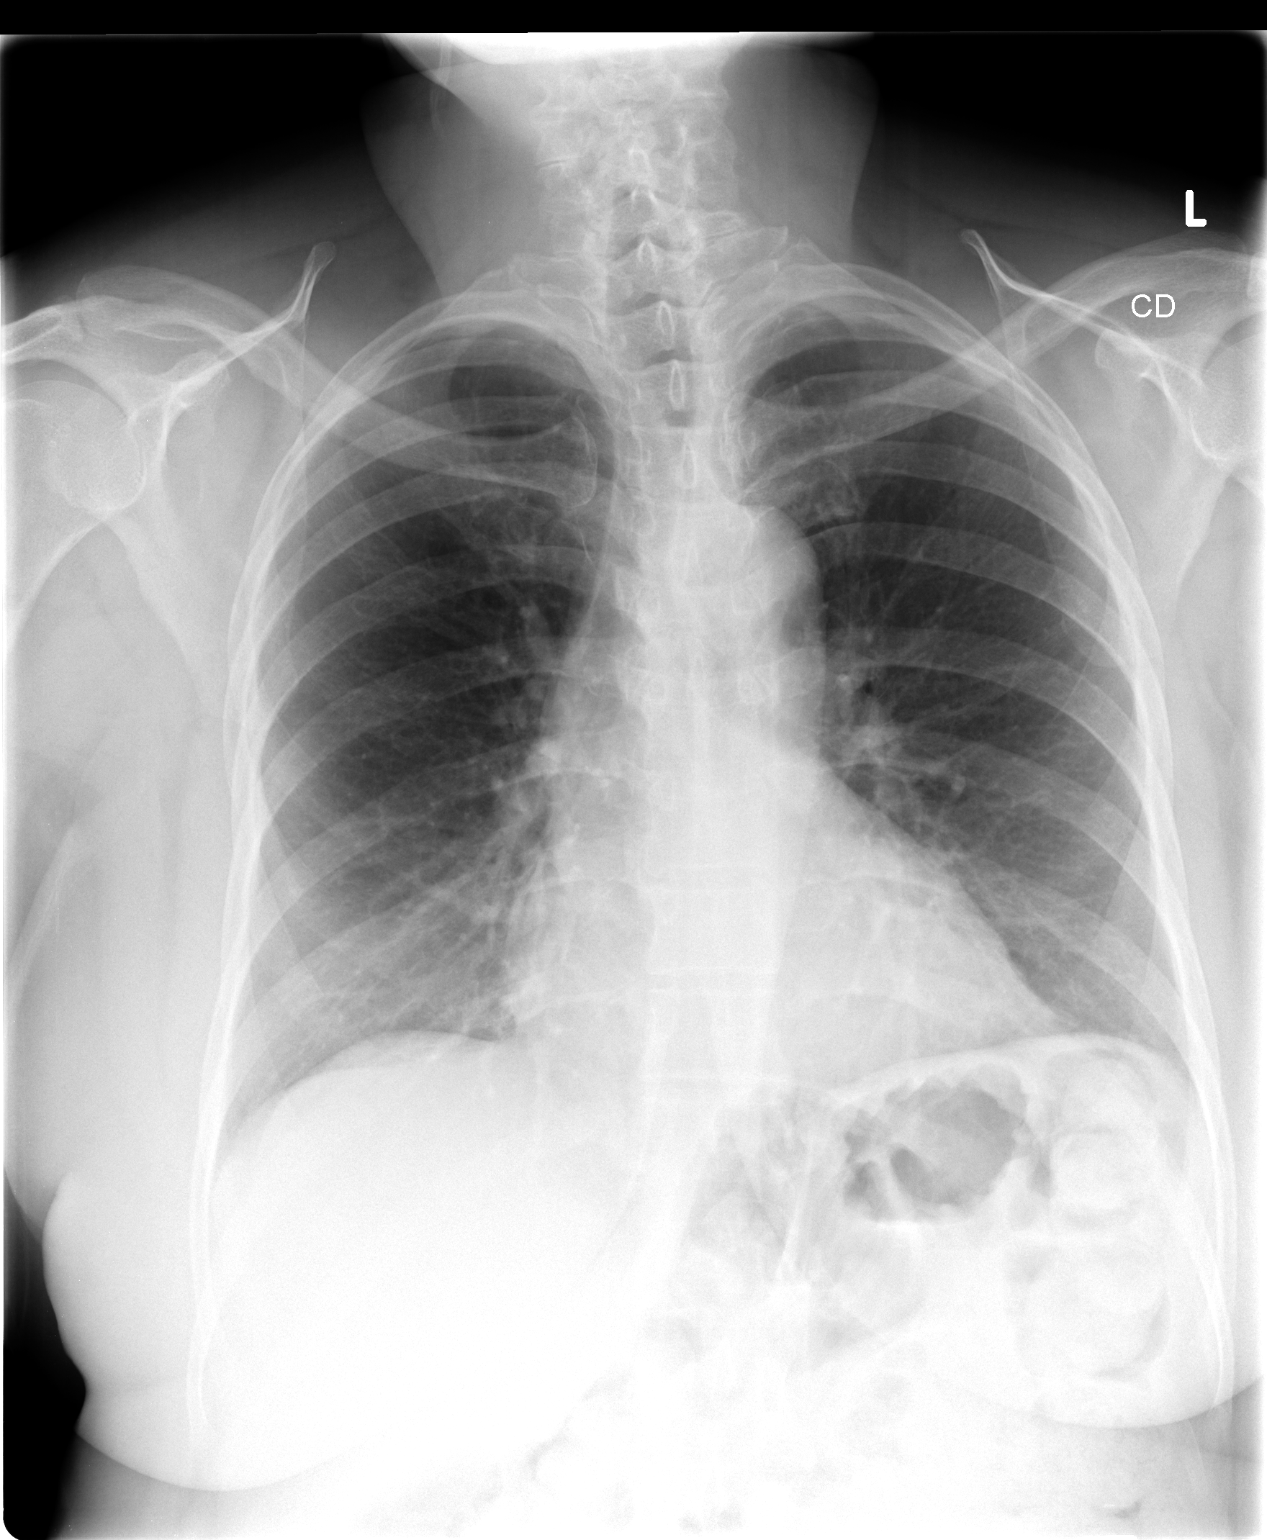

[view not recorded (2 of 2)]
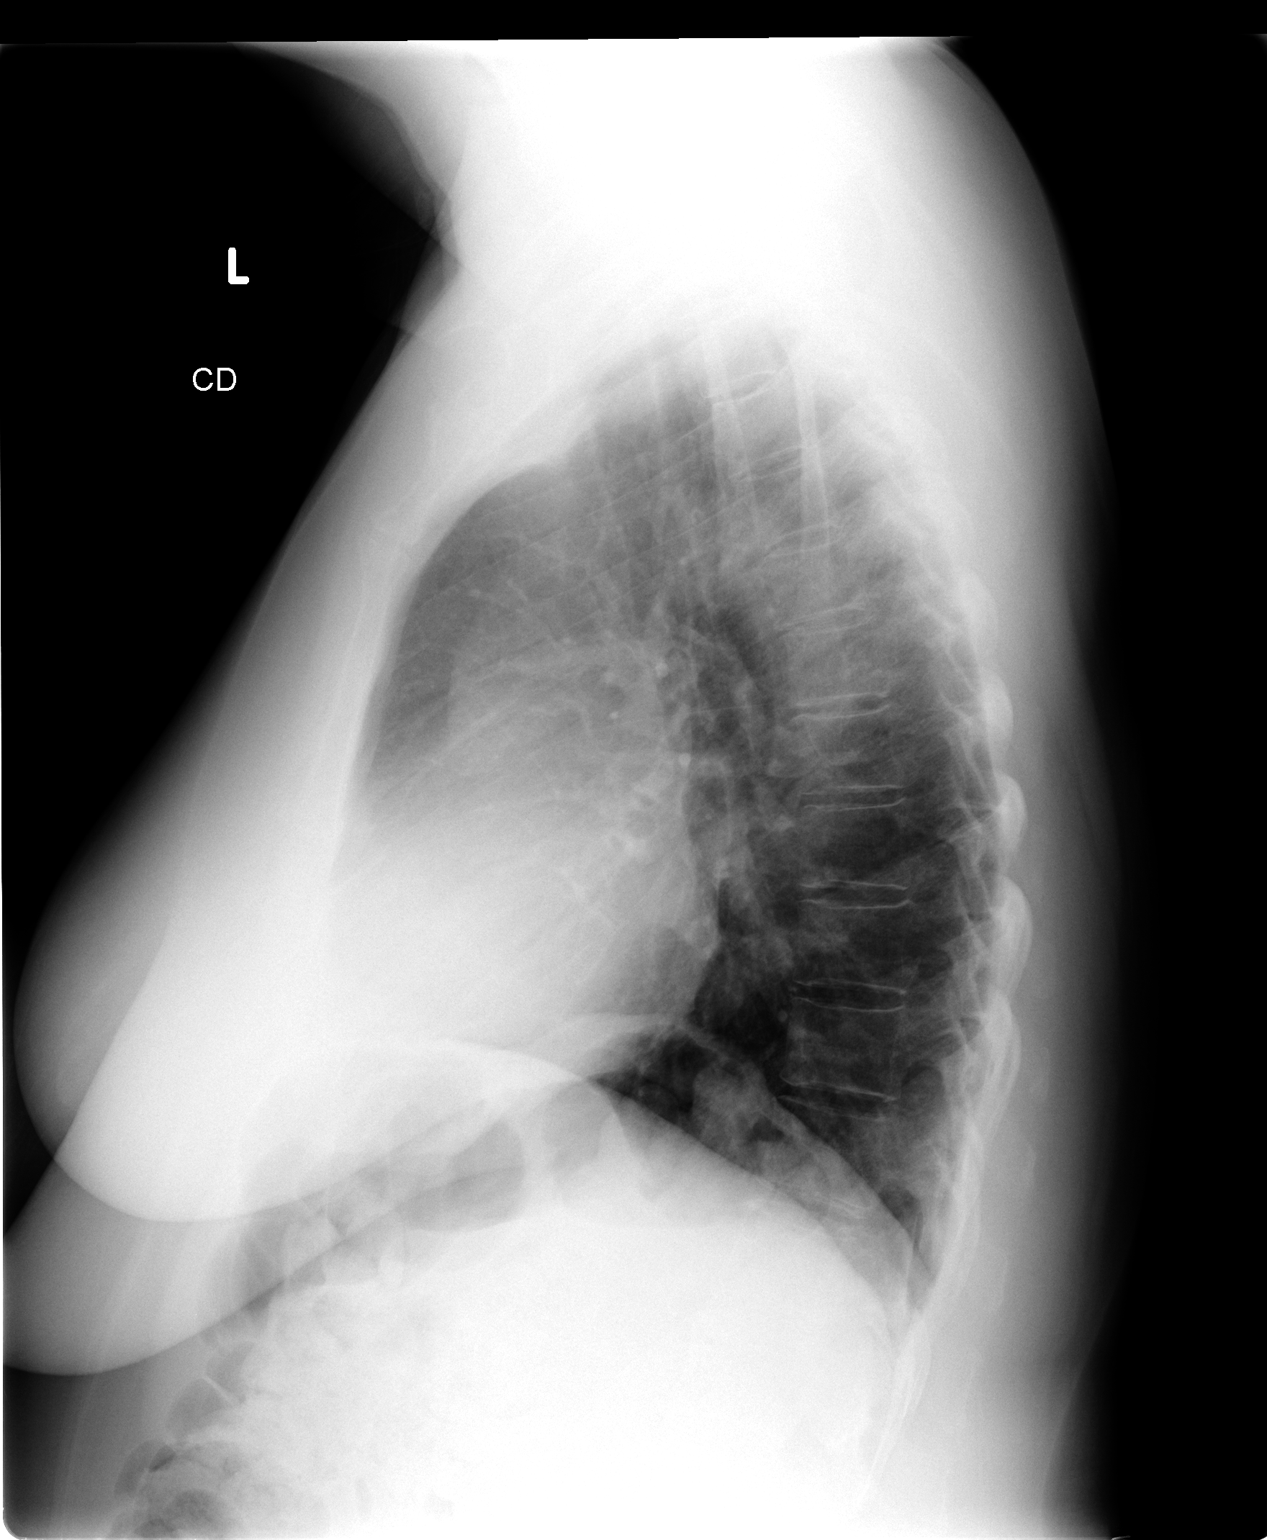

[2 of 2 positions shown; findings below may reference images not displayed]

FINDINGS: Lungs are essentially clear. No pleural effusion or
pneumothorax.

Cardiomediastinal silhouette is within normal limits.

Mild degenerative changes of the visualized thoracolumbar spine.
IMPRESSION: No evidence of acute cardiopulmonary disease.

## 2012-03-25 ENCOUNTER — Encounter: Payer: Self-pay | Admitting: Podiatry

## 2012-04-07 ENCOUNTER — Ambulatory Visit (INDEPENDENT_AMBULATORY_CARE_PROVIDER_SITE_OTHER): Payer: Medicaid Other | Admitting: Podiatry

## 2012-04-07 ENCOUNTER — Encounter: Payer: Self-pay | Admitting: Podiatry

## 2012-04-07 VITALS — BP 153/84 | HR 67 | Ht 66.0 in | Wt 199.0 lb

## 2012-04-07 DIAGNOSIS — M25579 Pain in unspecified ankle and joints of unspecified foot: Secondary | ICD-10-CM

## 2012-04-07 DIAGNOSIS — M25572 Pain in left ankle and joints of left foot: Secondary | ICD-10-CM

## 2012-04-07 DIAGNOSIS — M216X2 Other acquired deformities of left foot: Secondary | ICD-10-CM

## 2012-04-07 DIAGNOSIS — M216X9 Other acquired deformities of unspecified foot: Secondary | ICD-10-CM

## 2012-04-07 DIAGNOSIS — L600 Ingrowing nail: Secondary | ICD-10-CM

## 2012-04-07 NOTE — Progress Notes (Signed)
SUBJECTIVE: 62 y.o. year old female presents for pain on left great toe. Patient has had multiple foot problems in past. At this time the deformed great toe nail is requiring surgical intervention. Patient is asking to have other foot problems to be corrected. Previous diagnosis include Ankle equinus and short first ray with hyperpronation of Subtalar joint bilateral.   REVIEW OF SYSTEMS: Pertinent items are noted in HPI.  OBJECTIVE: DERMATOLOGIC EXAMINATION: Nails: all present. Skin Integrity: within normal.  Positive of thick callus build up under the 2nd and 3rd MPJ of left foot.  Abnormal nail on left hallux medial border with pain.   VASCULAR EXAMINATION OF LOWER LIMBS: Pedal pulses: All pedal pulses are palpable with normal pulsation.  DCapillary Filling times within 3 seconds in all digits.  No edema and no ischemic changes noted. Temperature gradient from tibial crest to dorsum of foot is within normal bilateral.  NEUROLOGIC EXAMINATION OF THE LOWER LIMBS: All epicritic and tactile sensations grossly intact.   MUSCULOSKELETAL EXAMINATION: Positive findings of tight Achilles tendon, short first ray with forefoot varus.  RADIOGRAPHIC FINDINGS: Old X-ray reviewed. Left foot reveal post bunion surgery with screw fixation. Short first ray, increased lateral deviation of the Calcaneocuboid joint angle.   ASSESSMENT: 1. Symptomatic ingrown nail left hallux lateral border. 2. Ankle equinus left. 3. Short first ray with Hallux limitus left.   PLAN: Reviewed clinical findings and available treatment options. Patient requests to have all problems on left foot to be corrected at this time. Surgery consent form was reviewed for Tendo Achilles Lengthening left, Cotton osteotomy with bone graft left, and Excision of nail and matrix lateral border left. Patient understands it will require 6 weeks in cast following the surgery and CAM walker for 4 weeks.

## 2012-04-10 DIAGNOSIS — Q666 Other congenital valgus deformities of feet: Secondary | ICD-10-CM

## 2012-04-10 DIAGNOSIS — M216X9 Other acquired deformities of unspecified foot: Secondary | ICD-10-CM

## 2012-04-10 HISTORY — PX: OTHER SURGICAL HISTORY: SHX169

## 2012-04-11 ENCOUNTER — Ambulatory Visit (INDEPENDENT_AMBULATORY_CARE_PROVIDER_SITE_OTHER): Payer: Medicaid Other | Admitting: Podiatry

## 2012-04-11 ENCOUNTER — Telehealth: Payer: Self-pay | Admitting: Podiatry

## 2012-04-11 ENCOUNTER — Encounter: Payer: Self-pay | Admitting: Podiatry

## 2012-04-11 VITALS — BP 137/73 | HR 69 | Temp 99.7°F | Ht 66.0 in | Wt 190.0 lb

## 2012-04-11 DIAGNOSIS — R609 Edema, unspecified: Secondary | ICD-10-CM

## 2012-04-11 NOTE — Progress Notes (Signed)
Subjective: One day post surgical foot left.  Patient called and stated that her cast feel tight. Patient was told to come in. Patient is getting her post op  medication today. Objective:  Patient left foot and leg cast is intact. All digits show normal color and able to wiggle. Patient feels some tightness at top of foot area. All other area feels ok except incision site of back of ankle and top of foot. Normal skin color without evidence of edema.  Patient denies calf pain, fever or chill.  Assessment:  Normal cast without evidence of tight cast.  Plan:  assured that the left foot and leg appears to be ok. Patient is to take medication as prescribed, elevate the limb, and rest at home.

## 2012-04-11 NOTE — Telephone Encounter (Signed)
Patient has just called @ 3:00, States she has kept her foot elevated but cast feels tight, She is on her way to the Pharmacy, Please tell Dr. Raynald Kemp

## 2012-04-14 ENCOUNTER — Encounter: Payer: Medicaid Other | Admitting: Podiatry

## 2012-04-15 ENCOUNTER — Encounter: Payer: Self-pay | Admitting: Podiatry

## 2012-04-15 ENCOUNTER — Ambulatory Visit (INDEPENDENT_AMBULATORY_CARE_PROVIDER_SITE_OTHER): Payer: Medicaid Other | Admitting: Podiatry

## 2012-04-15 VITALS — BP 144/77 | HR 77 | Temp 98.4°F

## 2012-04-15 DIAGNOSIS — Z9889 Other specified postprocedural states: Secondary | ICD-10-CM

## 2012-04-15 NOTE — Progress Notes (Signed)
History of present illness: S/P left foot surgery, Cotton Osteotomy, TAL, Excision nail matrix left great toe. Has had temperature off and on till Sunday. Denies having chills. Today oral temperature was 98.5 Patient came in on a cast. Was sick last Saturday (Apr. 6) possibly from too much medication. She was throwing up, head ache at the back of neck, and mouth was breaking up. She had two spots in her mouth and still smell something through her mouth. Explained possibly due to slow metabolism following anesthesia.  Once stopped taking medication (Relafen), she is not throwing up. She is doing ok with pain pills though.   Objective Findings:  Left great toe dressing removed from Matrixectomy site. Clean and dry wound under the Xeroform gauze. No drainage noted. Cast is in good maintenance. All digits have normal color without edema and able to wiggle.   A: Normal post op findings.  GI trouble with Relafen. Problem resolved once stop taking medication.   P: Return in 2 weeks to change cast.

## 2012-04-18 ENCOUNTER — Telehealth: Payer: Self-pay | Admitting: Podiatry

## 2012-04-18 NOTE — Telephone Encounter (Signed)
Pt Called for refill for Oxcydone 10 mg. Called 4:45 04/18/2012

## 2012-04-21 ENCOUNTER — Encounter (HOSPITAL_COMMUNITY): Payer: Self-pay | Admitting: *Deleted

## 2012-04-21 ENCOUNTER — Emergency Department (HOSPITAL_BASED_OUTPATIENT_CLINIC_OR_DEPARTMENT_OTHER): Payer: Medicaid Other

## 2012-04-21 ENCOUNTER — Encounter (HOSPITAL_BASED_OUTPATIENT_CLINIC_OR_DEPARTMENT_OTHER): Payer: Self-pay

## 2012-04-21 ENCOUNTER — Emergency Department (HOSPITAL_COMMUNITY)
Admission: EM | Admit: 2012-04-21 | Discharge: 2012-04-21 | Discharge status: Against Medical Advice | Payer: Medicaid Other | Source: Home / Self Care | Attending: Emergency Medicine | Admitting: Emergency Medicine

## 2012-04-21 ENCOUNTER — Emergency Department (HOSPITAL_BASED_OUTPATIENT_CLINIC_OR_DEPARTMENT_OTHER)
Admission: EM | Admit: 2012-04-21 | Discharge: 2012-04-21 | Disposition: A | Discharge status: Home / Self Care | Payer: Medicaid Other | Attending: Emergency Medicine | Admitting: Emergency Medicine

## 2012-04-21 DIAGNOSIS — Z8739 Personal history of other diseases of the musculoskeletal system and connective tissue: Secondary | ICD-10-CM | POA: Insufficient documentation

## 2012-04-21 DIAGNOSIS — Z9889 Other specified postprocedural states: Secondary | ICD-10-CM | POA: Insufficient documentation

## 2012-04-21 DIAGNOSIS — R0602 Shortness of breath: Secondary | ICD-10-CM | POA: Insufficient documentation

## 2012-04-21 DIAGNOSIS — M7989 Other specified soft tissue disorders: Secondary | ICD-10-CM | POA: Insufficient documentation

## 2012-04-21 DIAGNOSIS — I808 Phlebitis and thrombophlebitis of other sites: Secondary | ICD-10-CM | POA: Insufficient documentation

## 2012-04-21 DIAGNOSIS — R079 Chest pain, unspecified: Secondary | ICD-10-CM | POA: Insufficient documentation

## 2012-04-21 DIAGNOSIS — Z8669 Personal history of other diseases of the nervous system and sense organs: Secondary | ICD-10-CM | POA: Insufficient documentation

## 2012-04-21 LAB — BASIC METABOLIC PANEL
Chloride: 105 mEq/L (ref 96–112)
GFR calc Af Amer: 80 mL/min — ABNORMAL LOW (ref >90)
GFR calc non Af Amer: 69 mL/min — ABNORMAL LOW (ref >90)
Glucose, Bld: 94 mg/dL (ref 70–99)
Potassium: 3.5 mEq/L (ref 3.5–5.1)
Sodium: 141 mEq/L (ref 135–145)

## 2012-04-21 LAB — CBC WITH DIFFERENTIAL/PLATELET
Basophils Relative: 0 % (ref 0–1)
Eosinophils Absolute: 0.2 10*3/uL (ref 0.0–0.7)
Lymphs Abs: 2.1 10*3/uL (ref 0.7–4.0)
MCH: 27.2 pg (ref 26.0–34.0)
Neutro Abs: 6 10*3/uL (ref 1.7–7.7)
Neutrophils Relative %: 66 % (ref 43–77)
Platelets: 350 10*3/uL (ref 150–400)
RBC: 4.78 MIL/uL (ref 3.87–5.11)

## 2012-04-21 LAB — PROTIME-INR: INR: 0.96 (ref 0.00–1.49)

## 2012-04-21 LAB — APTT: aPTT: 30 seconds (ref 24–37)

## 2012-04-21 MED ORDER — SODIUM CHLORIDE 0.9 % IV BOLUS (SEPSIS)
1000.0000 mL | Freq: Once | INTRAVENOUS | Status: AC
  Administered 2012-04-21: 1000 mL via INTRAVENOUS

## 2012-04-21 MED ORDER — ASPIRIN 325 MG PO TABS: 325.0000 mg | ORAL_TABLET | Freq: Every day | ORAL | Status: DC

## 2012-04-21 MED ORDER — IOHEXOL 350 MG/ML SOLN
100.0000 mL | Freq: Once | INTRAVENOUS | Status: AC | PRN
  Administered 2012-04-21: 100 mL via INTRAVENOUS

## 2012-04-21 NOTE — ED Notes (Signed)
Pt. Tolerated IV stick well and is in CT at this time.

## 2012-04-21 NOTE — ED Notes (Addendum)
Pt has surgery one week ago on her left foot to relieve tight tenderness in left heel. Pt states that she noticed swelling in her right hand where she had her IV the night after her surgery. Pt states that she had been putting it off coming into the doctors office to get looked at. Pt states she finally came in when she couldn't use her right hand anymore. Pt states she cannot write with her right hand. Pt states she has numbness in her fingers from time to time. Pt states the pain is radiating up her right arm all the way to her shoulder. Pt states the she feels tightness in her hand. Pt's right hand warm to touch, pt's right forearm warm to touch. Pt cap refill is WNL and radial pulses are bilaterally equal.

## 2012-04-21 NOTE — ED Notes (Signed)
Pt also states she is now having a headache. Pt states that a few minutes ago she experienced pain in her left shoulder for a brief moment.

## 2012-04-21 NOTE — ED Notes (Signed)
Pt states R hand has been swollen x 1 week, painful. Denies injury.

## 2012-04-21 NOTE — ED Notes (Signed)
Per patient, her hand has been hurting and swelling since Saturday. Denies any trauma or injury. Pt states that she had an IV in that hand when she had her foot surgery a week ago.

## 2012-04-21 NOTE — ED Provider Notes (Signed)
History     CSN: [id number]  Arrival date & time 04/21/12  1330   First MD Initiated Contact with Patient 04/21/12 1354      Chief Complaint  Patient presents with  . Hand swelling     (Consider location/radiation/quality/duration/timing/severity/associated sxs/prior treatment) HPI  Past Medical History  Diagnosis Date  . Back pain   . Achilles tendonitis 10/2010  . Ankle deformity 10/2010  . Peripheral neuropathy 10/2010    Secondary to Lumbar Radiculopathy  . Lumbar radiculopathy 10/2010    Past Surgical History  Procedure Laterality Date  . [place] bunionectomy Left 2003  . Cotton osteotomy w/ graft Left 04/10/12  . Tal lengthening Left 04/11/22  . Nail matrixectomy Left 04/10/12    No family history on file.  History  Substance Use Topics  . Smoking status: Never Smoker   . Smokeless tobacco: Not on file  . Alcohol Use: No    OB History   Grav Para Term Preterm Abortions TAB SAB Ect Mult Living                  Review of Systems  Allergies  Demerol and Penicillins  Home Medications   Current Outpatient Rx  Name  Route  Sig  Dispense  Refill  . oxyCODONE (OXYCONTIN) 10 MG 12 hr tablet   Oral   Take 10 mg by mouth every 12 (twelve) hours.           BP 157/83  Pulse 76  Temp(Src) 98.4 F (36.9 C) (Oral)  Resp 18  SpO2 94%  Physical Exam  ED Course  Procedures (including critical care time)  Labs Reviewed - No data to display No results found.   No diagnosis found.    MDM   Pt was triaged but left prior to my evaluation.  Garlon Hatchet, PA-C 04/21/12 1544

## 2012-04-21 NOTE — ED Notes (Signed)
Pt not in room, PA went to see pt and pt not in room 15 minutes ago.

## 2012-04-21 NOTE — ED Provider Notes (Signed)
History  This chart was scribed for [name], MD by Ardeen Jourdain, ED Scribe. This patient was seen in room MH10/MH10 and the patient's care was started at 1545.  CSN: [id number]  Arrival date & time 04/21/12  1537   First MD Initiated Contact with Patient 04/21/12 1545      Chief Complaint  Patient presents with  . Hand Pain     The history is provided by the patient. No language interpreter was used.    Kelly Rosales is a 62 y.o. female who presents to the Emergency Department complaining of gradual onset, gradually worsening, constant right hand pain and swelling. Pt is also c/o intermittent mid sternal CP that began a few weeks with associated SOB. She reports the last episode of CP was this morning. She states the intermittent episodes will last minutes with the longest episode lasting 30 minutes. Pt recently had left foot surgery and had the IV placed in her right hand. Pt states the surgery was 3/31. She states she has had intermittent sharp pains radiate up her right arm. She states she had nausea, emesis, fever, chills and numbness in the hand 3-4 days ago. She denies any of these symptoms currently.    Past Medical History  Diagnosis Date  . Back pain   . Achilles tendonitis 10/2010  . Ankle deformity 10/2010  . Peripheral neuropathy 10/2010    Secondary to Lumbar Radiculopathy  . Lumbar radiculopathy 10/2010    Past Surgical History  Procedure Laterality Date  . [place] bunionectomy Left 2003  . Cotton osteotomy w/ graft Left 04/10/12  . Tal lengthening Left 04/11/22  . Nail matrixectomy Left 04/10/12  . Foot surgery      No family history on file.  History  Substance Use Topics  . Smoking status: Never Smoker   . Smokeless tobacco: Not on file  . Alcohol Use: No    OB History   Grav Para Term Preterm Abortions TAB SAB Ect Mult Living                  Review of Systems  A complete 10 system review of systems was obtained and all systems  are negative except as noted in the HPI and PMH.   Allergies  Demerol and Penicillins  Home Medications   Current Outpatient Rx  Name  Route  Sig  Dispense  Refill  . oxyCODONE (OXYCONTIN) 10 MG 12 hr tablet   Oral   Take 10 mg by mouth every 12 (twelve) hours.           Triage Vitals: BP 145/99  Pulse 69  Temp(Src) 98.5 F (36.9 C) (Oral)  Resp 20  Wt 190 lb (86.183 kg)  BMI 30.68 kg/m2  SpO2 99%  Physical Exam  Nursing note and vitals reviewed. Constitutional: She is oriented to person, place, and time. She appears well-developed and well-nourished. No distress.  HENT:  Head: Normocephalic and atraumatic.  Eyes: EOM are normal. Pupils are equal, round, and reactive to light.  Neck: Normal range of motion. Neck supple.  Cardiovascular: Normal rate, normal heart sounds and intact distal pulses.   Pulmonary/Chest: Effort normal and breath sounds normal.  Abdominal: Bowel sounds are normal. She exhibits no distension. There is no tenderness.  Musculoskeletal: Normal range of motion. She exhibits no edema and no tenderness.  Soft tissue swelling right hand, left lower lag in cast with 1+ edema to shin, neurovascularly intact   Neurological: She is alert and  oriented to person, place, and time. She has normal strength. No cranial nerve deficit or sensory deficit.  Skin: Skin is warm and dry. No rash noted. She is not diaphoretic.  Psychiatric: She has a normal mood and affect.    ED Course  Procedures (including critical care time)  DIAGNOSTIC STUDIES: Oxygen Saturation is 99% on room air, normal by my interpretation.    COORDINATION OF CARE:  4:00 PM-Discussed treatment plan which includes EKG, CBC, BMP, Protime INR, APTT, CT and [place] with pt at bedside and pt agreed to plan.    Labs Reviewed  BASIC METABOLIC PANEL - Abnormal; Notable for the following:    GFR calc non Af Amer 69 (*)    GFR calc Af Amer 80 (*)    All other components within normal limits  CBC  WITH DIFFERENTIAL  PROTIME-INR  APTT   Ct Angio Chest Pe W/cm &/or Wo Cm  04/21/2012  *RADIOLOGY REPORT*  Clinical Data: Chest pain and short of breath.  Recent surgery. Rule out pulmonary embolus.  CT ANGIOGRAPHY CHEST  Technique:  Multidetector CT imaging of the chest using the standard protocol during bolus administration of intravenous contrast. Multiplanar reconstructed images including MIPs were obtained and reviewed to evaluate the vascular anatomy.  Contrast: OMNIPAQUE IOHEXOL 350 MG/ML SOLN  Comparison: Chest x-ray 11/03/2010  Findings: Negative for pulmonary embolism.  Negative for aortic dissection.  Mild coronary artery calcification involving the left coronary artery.  Heart size is normal.  No pericardial effusion.  The lungs are clear without infiltrate or effusion.  No mass or adenopathy.  IMPRESSION: Negative for pulmonary embolism.  No acute abnormality.  Mild coronary artery calcification.   Original Report Authenticated By: Janeece Riggers, M.D.    US Venous Img Upper Uni Right  04/21/2012  *RADIOLOGY REPORT*  Clinical Data: Swelling of the hand .  RIGHT UPPER EXTREMITY VENOUS ULTRASOUND  Comparison: None.  Findings: The deep venous system of the left upper extremity is patent with normal compression and normal phasicity.  Normal color flow and Doppler wave forms.  There is partial thrombosis of the cephalic vein extending from the wrist to the mid upper arm.  IMPRESSION: Partial thrombosis of the superficial/cephalic venous system from the level of the wrist up to the mid portion of the upper arm.  The deep system does not show thrombosis.   Original Report Authenticated By: Paulina Fusi, M.D.      1. Superficial thrombophlebitis of arm       MDM  Pt with persistent R hand pain, 2 weeks s/p L foot surgery, suspected to be thrombophlebitis, but she also reports intermittent episodes of chest pain and SOB since surgery. Hand is not red or warm. Doubt infection. Will check for  DVT or PE.    Date: 04/21/2012  Rate: 57  Rhythm: sinus brady rhythm  QRS Axis: normal  Intervals: normal  ST/T Wave abnormalities: normal  Conduction Disutrbances: none  Narrative Interpretation: unremarkable   6:49 PM Labs and imaging as above. No PE. Superficial thrombophlebitis of RUE, but no DVT. Advised warm compress. ASA daily to prevent propagation and returning for any worsening. Do not feel anticoagulation needed at this point. She will need followup US in about a week.     I personally performed the services described in this documentation, which was scribed in my presence. The recorded information has been reviewed and is accurate.     Carlissa Pesola B. Bernette Mayers, MD 04/21/12 [phone]

## 2012-04-21 NOTE — ED Notes (Signed)
Attempted to check on pt, pt not in room. Pt at ultrasound.

## 2012-04-22 NOTE — ED Provider Notes (Signed)
Medical screening examination/treatment/procedure(s) were performed by non-physician practitioner and as supervising physician I was immediately available for consultation/collaboration.   Olamide Lahaie L Zaliyah Meikle, MD 04/22/12 0748 

## 2012-04-30 ENCOUNTER — Emergency Department (HOSPITAL_BASED_OUTPATIENT_CLINIC_OR_DEPARTMENT_OTHER)
Admission: EM | Admit: 2012-04-30 | Discharge: 2012-04-30 | Disposition: A | Discharge status: Home / Self Care | Payer: Medicaid Other | Attending: Emergency Medicine | Admitting: Emergency Medicine

## 2012-04-30 ENCOUNTER — Encounter (HOSPITAL_BASED_OUTPATIENT_CLINIC_OR_DEPARTMENT_OTHER): Payer: Self-pay | Admitting: *Deleted

## 2012-04-30 ENCOUNTER — Ambulatory Visit (INDEPENDENT_AMBULATORY_CARE_PROVIDER_SITE_OTHER): Payer: Medicaid Other | Admitting: Podiatry

## 2012-04-30 ENCOUNTER — Emergency Department (HOSPITAL_BASED_OUTPATIENT_CLINIC_OR_DEPARTMENT_OTHER): Payer: Medicaid Other

## 2012-04-30 DIAGNOSIS — M216X9 Other acquired deformities of unspecified foot: Secondary | ICD-10-CM

## 2012-04-30 DIAGNOSIS — Z880 Allergy status to penicillin: Secondary | ICD-10-CM | POA: Insufficient documentation

## 2012-04-30 DIAGNOSIS — Z8739 Personal history of other diseases of the musculoskeletal system and connective tissue: Secondary | ICD-10-CM | POA: Insufficient documentation

## 2012-04-30 DIAGNOSIS — Z7982 Long term (current) use of aspirin: Secondary | ICD-10-CM | POA: Insufficient documentation

## 2012-04-30 DIAGNOSIS — I808 Phlebitis and thrombophlebitis of other sites: Secondary | ICD-10-CM | POA: Insufficient documentation

## 2012-04-30 DIAGNOSIS — Z9889 Other specified postprocedural states: Secondary | ICD-10-CM

## 2012-04-30 DIAGNOSIS — Z8669 Personal history of other diseases of the nervous system and sense organs: Secondary | ICD-10-CM | POA: Insufficient documentation

## 2012-04-30 DIAGNOSIS — R61 Generalized hyperhidrosis: Secondary | ICD-10-CM | POA: Insufficient documentation

## 2012-04-30 DIAGNOSIS — M21962 Unspecified acquired deformity of left lower leg: Secondary | ICD-10-CM

## 2012-04-30 DIAGNOSIS — M25579 Pain in unspecified ankle and joints of unspecified foot: Secondary | ICD-10-CM

## 2012-04-30 DIAGNOSIS — M25572 Pain in left ankle and joints of left foot: Secondary | ICD-10-CM

## 2012-04-30 NOTE — Progress Notes (Signed)
S:  3 weeks post surgical visit, Cotton osteotomy with bone graft and TAL left.  Patient came ambulating in cast. Patient denies any problem with the cast or the surgical site. Stated that she was in ER for hand problem and have to be return back to ER for follow up.  O:  Cast was removed. All incisions are healed and coapt well.  Post op X-ray reveal lateral 1/4 of grafted bone lateral shifting into Middle cuneiform bone in AP view. Lateral view show the grafted bone is in same level as immediate post op x-ray, countersunk beneath the dorsal periosteal layer.   A:  Post op wound healing without clinical sign of complication. Will monitor closely.  Tx.  Surgical wounds were cleansed with soap and water. The affected lower limb was placed in Fiber glass cast with adequate cotton under padding.  Patient stated that the cast is feeling fine.  Return in 3 weeks

## 2012-04-30 NOTE — ED Notes (Signed)
Pt states she is here for f/u recheck [place] of right hand

## 2012-04-30 NOTE — ED Provider Notes (Signed)
History     CSN: [id number]  Arrival date & time 04/30/12  1806   First MD Initiated Contact with Patient 04/30/12 2029      Chief Complaint  Patient presents with  . Follow-up    (Consider location/radiation/quality/duration/timing/severity/associated sxs/prior treatment) Patient is a 62 y.o. female presenting with general illness. The history is provided by the patient. No language interpreter was used.  Illness  The current episode started more than 2 weeks ago. The onset was gradual (Patient had been seen on 04/21/2012 for pain and swelling in the right arm. She had an IV in his arm for use during surgery for her left foot. Her ultrasound at that time showed a superficial thrombophlebitis from her right hand to her mid upper arm. ). The problem occurs continuously (She was treated with daily aspirin and advised to return for repeat ultrasonography in approximately one week.). The problem has been gradually improving. The problem is mild. Nothing relieves the symptoms. Nothing aggravates the symptoms. Pertinent negatives include no fever. Associated symptoms comments: She has noticed some sweating at night.. Recently, medical care has been given by a specialist and at this facility. Services received include medications given.    Past Medical History  Diagnosis Date  . Back pain   . Achilles tendonitis 10/2010  . Ankle deformity 10/2010  . Peripheral neuropathy 10/2010    Secondary to Lumbar Radiculopathy  . Lumbar radiculopathy 10/2010    Past Surgical History  Procedure Laterality Date  . [place] bunionectomy Left 2003  . Cotton osteotomy w/ graft Left 04/10/12  . Tal lengthening Left 04/11/22  . Nail matrixectomy Left 04/10/12  . Foot surgery      History reviewed. No pertinent family history.  History  Substance Use Topics  . Smoking status: Never Smoker   . Smokeless tobacco: Not on file  . Alcohol Use: No    OB History   Grav Para Term Preterm Abortions TAB SAB Ect  Mult Living                  Review of Systems  Constitutional: Negative for fever and chills.  HENT: Negative.   Eyes: Negative.   Respiratory: Negative.   Cardiovascular: Negative.   Gastrointestinal: Negative.   Genitourinary: Negative.   Musculoskeletal:       Tender 2 mm nodule on dorsum of right hand.  Skin: Negative.   Neurological: Negative.   Psychiatric/Behavioral: Negative.     Allergies  Demerol and Penicillins  Home Medications   Current Outpatient Rx  Name  Route  Sig  Dispense  Refill  . aspirin 325 MG tablet   Oral   Take 1 tablet (325 mg total) by mouth daily.   30 tablet   0   . oxyCODONE (OXYCONTIN) 10 MG 12 hr tablet   Oral   Take 10 mg by mouth every 12 (twelve) hours.           BP 129/95  Pulse 73  Temp(Src) 98.5 F (36.9 C) (Oral)  Resp 16  Ht 5\' 7"  (1.702 m)  Wt 202 lb (91.627 kg)  BMI 31.63 kg/m2  SpO2 100%  Physical Exam  Nursing note and vitals reviewed. Constitutional: She is oriented to person, place, and time. She appears well-developed and well-nourished. No distress.  HENT:  Head: Normocephalic and atraumatic.  Right Ear: External ear normal.  Left Ear: External ear normal.  Mouth/Throat: Oropharynx is clear and moist.  Eyes: Conjunctivae and EOM are normal. Pupils are  equal, round, and reactive to light. No scleral icterus.  Neck: Normal range of motion. Neck supple.  Cardiovascular: Normal rate and normal heart sounds.   Pulmonary/Chest: Effort normal and breath sounds normal.  Abdominal: Soft. Bowel sounds are normal.  Musculoskeletal:  She has a tender 2 mm nodule on the dorsum of the right hand, probably a thrombosed vein.  The right arm has no swelling, tenderness, redness or heat.  She has intact pulses, sensation and tendon function in the right hand.  The left foot is in a case and a postop shoe.  Neurological: She is alert and oriented to person, place, and time.  No sensory or motor deficit.  Skin: Skin is  warm and dry.  Psychiatric: She has a normal mood and affect. Her behavior is normal.    ED Course  Procedures (including critical care time)     US Venous Img Upper Uni Right  04/30/2012  *RADIOLOGY REPORT*  Clinical Data: Thrombophlebitis of right upper arm diagnosed 04/14. Right hand pain.  RIGHT UPPER EXTREMITY VENOUS DUPLEX ULTRASOUND  Technique:  Gray-scale sonography with graded compression, as well as color Doppler and duplex ultrasound were performed to evaluate the upper extremity deep venous system from the level of the subclavian vein and including the jugular, axillary, basilic and upper cephalic vein.  Spectral Doppler was utilized to evaluate flow at rest and with distal augmentation maneuvers.  Comparison:  04/21/2012  Findings: No evidence of deep venous thrombosis.  Normal flow, compressibility, and augmentation within the subclavian, axillary, basilic, brachial, radial, and ulnar veins.  The cephalic vein is compressible throughout the imaged portion, including to the wrist. Good color Doppler signal cannot be identified in the distal cephalic vein at the level of the forearm.  This is favored to be secondary small venous size.  IMPRESSION: Improved appearance of the cephalic vein, favored to be due to resolution of previously described thrombus.  No evidence of central extension.   Original Report Authenticated By: Jeronimo Greaves, M.D.    US Venous Img Upper Uni Right  04/21/2012  *RADIOLOGY REPORT*  Clinical Data: Swelling of the hand .  RIGHT UPPER EXTREMITY VENOUS ULTRASOUND  Comparison: None.  Findings: The deep venous system of the left upper extremity is patent with normal compression and normal phasicity.  Normal color flow and Doppler wave forms.  There is partial thrombosis of the cephalic vein extending from the wrist to the mid upper arm.  IMPRESSION: Partial thrombosis of the superficial/cephalic venous system from the level of the wrist up to the mid portion of the upper  arm.  The deep system does not show thrombosis.   Original Report Authenticated By: Paulina Fusi, M.D.    Her repeat ultrasound shows resolution of the superficial thrombophlebitis of the cephalic vein.  Advised to continue aspirin for another two weeks.     1. Thrombophlebitis of arm, right        Carleene Cooper III, MD 05/01/12 1049

## 2012-05-01 ENCOUNTER — Encounter: Payer: Medicaid Other | Admitting: Podiatry

## 2012-05-02 ENCOUNTER — Telehealth: Payer: Self-pay | Admitting: Podiatry

## 2012-05-02 ENCOUNTER — Ambulatory Visit (INDEPENDENT_AMBULATORY_CARE_PROVIDER_SITE_OTHER): Payer: Medicaid Other | Admitting: Podiatry

## 2012-05-02 DIAGNOSIS — M25579 Pain in unspecified ankle and joints of unspecified foot: Secondary | ICD-10-CM

## 2012-05-02 DIAGNOSIS — M25572 Pain in left ankle and joints of left foot: Secondary | ICD-10-CM

## 2012-05-02 MED ORDER — OXYCODONE HCL 10 MG PO TB12: 10.0000 mg | ORAL_TABLET | Freq: Two times a day (BID) | ORAL | Status: DC

## 2012-05-02 MED ORDER — ERYTHROMYCIN BASE 333 MG PO TBEC: 333.0000 mg | DELAYED_RELEASE_TABLET | Freq: Three times a day (TID) | ORAL | Status: DC

## 2012-05-02 NOTE — Progress Notes (Signed)
S:  Patient came stating that she started to have pain on her foot and has problem with cast being loose. New cast was put on two days ago 04/30/12. Pain started after the cast change.  Stated that she is out of pain medication and needs be refilled.  O:  Cast removed. Positive of mild localized edema over 2nd MCJ area with increased temperature. No redness.  X-ray taken: No change from previous X-ray.  A: Post op pain with questionable infection.  P: Continue with immobilization for the grafted bone to fuse.  Rx. Erythromycin 333mg  to take q 8 hr. Oxycodone 10mg  #60 reordered.   If pain continues next week, will re-evaluate.

## 2012-05-02 NOTE — Telephone Encounter (Signed)
Pt called this am . Cast hurts and Foot is sliding, hasnt slept all night.

## 2012-05-08 ENCOUNTER — Emergency Department (HOSPITAL_BASED_OUTPATIENT_CLINIC_OR_DEPARTMENT_OTHER)
Admission: EM | Admit: 2012-05-08 | Discharge: 2012-05-09 | Disposition: A | Discharge status: Home / Self Care | Payer: Self-pay | Attending: Emergency Medicine | Admitting: Emergency Medicine

## 2012-05-08 ENCOUNTER — Encounter (HOSPITAL_BASED_OUTPATIENT_CLINIC_OR_DEPARTMENT_OTHER): Payer: Self-pay

## 2012-05-08 DIAGNOSIS — R21 Rash and other nonspecific skin eruption: Secondary | ICD-10-CM | POA: Insufficient documentation

## 2012-05-08 DIAGNOSIS — Z8739 Personal history of other diseases of the musculoskeletal system and connective tissue: Secondary | ICD-10-CM | POA: Insufficient documentation

## 2012-05-08 DIAGNOSIS — Z880 Allergy status to penicillin: Secondary | ICD-10-CM | POA: Insufficient documentation

## 2012-05-08 DIAGNOSIS — M25579 Pain in unspecified ankle and joints of unspecified foot: Secondary | ICD-10-CM | POA: Insufficient documentation

## 2012-05-08 DIAGNOSIS — Z7982 Long term (current) use of aspirin: Secondary | ICD-10-CM | POA: Insufficient documentation

## 2012-05-08 DIAGNOSIS — Z8669 Personal history of other diseases of the nervous system and sense organs: Secondary | ICD-10-CM | POA: Insufficient documentation

## 2012-05-08 DIAGNOSIS — M79672 Pain in left foot: Secondary | ICD-10-CM

## 2012-05-08 DIAGNOSIS — G8918 Other acute postprocedural pain: Secondary | ICD-10-CM | POA: Insufficient documentation

## 2012-05-08 NOTE — ED Notes (Signed)
Left foot surgery 4/3-pt wearing cam walker-c/o increase in pain x 2 days ago

## 2012-05-09 ENCOUNTER — Encounter: Payer: Self-pay | Admitting: Podiatry

## 2012-05-09 ENCOUNTER — Ambulatory Visit (INDEPENDENT_AMBULATORY_CARE_PROVIDER_SITE_OTHER): Payer: Medicaid Other | Admitting: Podiatry

## 2012-05-09 VITALS — BP 139/73 | HR 62

## 2012-05-09 DIAGNOSIS — Z9889 Other specified postprocedural states: Secondary | ICD-10-CM

## 2012-05-09 MED ORDER — DIPHENHYDRAMINE HCL 25 MG PO TABS: 25.0000 mg | ORAL_TABLET | Freq: Four times a day (QID) | ORAL | Status: DC

## 2012-05-09 MED ORDER — HYDROCORTISONE 1 % EX CREA: TOPICAL_CREAM | CUTANEOUS | Status: DC

## 2012-05-09 NOTE — ED Provider Notes (Signed)
History     CSN: [id number]  Arrival date & time 05/08/12  2249   First MD Initiated Contact with Patient 05/09/12 [phone]      Chief Complaint  Patient presents with  . Foot Pain    (Consider location/radiation/quality/duration/timing/severity/associated sxs/prior treatment) HPI Pt presenting with concern for foot pain after surgery on foot 4/3.  She has been seeing her podiatrist in followup.  States the cast was revised several days ago and she was placed on antibiotics for possible wound infection.  No fever/chills, no vomiting, no redness or swelling of toes or leg.  She is taking the antibiotics as prescribed. She states the pain has continued since the surgery was performed one month ago.  She is also concerned about several areas of itching rash on her right posterior shoulder.  She is taking oxycontin for pain.    Past Medical History  Diagnosis Date  . Back pain   . Achilles tendonitis 10/2010  . Ankle deformity 10/2010  . Peripheral neuropathy 10/2010    Secondary to Lumbar Radiculopathy  . Lumbar radiculopathy 10/2010    Past Surgical History  Procedure Laterality Date  . [place] bunionectomy Left 2003  . Cotton osteotomy w/ graft Left 04/10/12  . Tal lengthening Left 04/11/22  . Nail matrixectomy Left 04/10/12  . Foot surgery      No family history on file.  History  Substance Use Topics  . Smoking status: Never Smoker   . Smokeless tobacco: Not on file  . Alcohol Use: No    OB History   Grav Para Term Preterm Abortions TAB SAB Ect Mult Living                  Review of Systems ROS reviewed and all otherwise negative except for mentioned in HPI  Allergies  Demerol and Penicillins  Home Medications   Current Outpatient Rx  Name  Route  Sig  Dispense  Refill  . aspirin 325 MG tablet   Oral   Take 1 tablet (325 mg total) by mouth daily.   30 tablet   0   . diphenhydrAMINE (BENADRYL) 25 MG tablet   Oral   Take 1 tablet (25 mg total) by mouth every 6  (six) hours.   20 tablet   0   . erythromycin (ERY-TAB) 333 MG EC tablet   Oral   Take 1 tablet (333 mg total) by mouth 3 (three) times daily.   30 tablet   0   . hydrocortisone cream 1 %      Apply to affected area 3 times daily   15 g   0   . oxyCODONE (OXYCONTIN) 10 MG 12 hr tablet   Oral   Take 1 tablet (10 mg total) by mouth every 12 (twelve) hours.   60 tablet   0     BP 140/79  Pulse 83  Temp(Src) 98.1 F (36.7 C)  Resp 16  Ht 5\' 7"  (1.702 m)  Wt 201 lb (91.173 kg)  BMI 31.47 kg/m2  SpO2 95% Vitals reviewed Physical Exam Physical Examination: General appearance - alert, well appearing, and in no distress Mental status - alert, oriented to person, place, and time Eyes - no scleral icterus, no conjunctival injection Mouth - mucous membranes moist, pharynx normal without lesions Chest - clear to auscultation, no wheezes, rales or rhonchi, symmetric air entry Heart - normal rate, regular rhythm, normal S1, S2, no murmurs, rubs, clicks or gallops Neurological - alert, oriented, normal  speech, sensation intact distally in toes of left lower extremity Musculoskeletal -  Cast in place on left lower extremity, otherwise no joint tenderness, deformity or swelling Extremities - peripheral pulses normal, no pedal edema, no clubbing or cyanosis Skin - normal coloration and turgor, erythematous pruritic papules overlying right posterior shoulder- approx 4 of these lesions- no evidence of abcess or cellulitis, no erythema of left lower extremity, toes with brisk cap refill  ED Course  Procedures (including critical care time)  Labs Reviewed - No data to display No results found.   1. Foot pain, left       MDM  Long discussion with patient about following up with her podiatrist.  She is requesting that the cast be cut off, there is no evidence of decreased circulation, she is currently taking erythromycin for infection of foot- she has no fever, no abnormal vital  signs.  She is overall nontoxic and well appearing.  I have advised her to call the podiatrist tomorrow.  I do not advised removing the case tonight- was just recasted several days ago at the podiatrist office- since we are not able to replace the cast.  I have a low suspicion for infection that would need to be treated tonight emergently.          Ethelda Chick, MD 05/09/12 [phone]

## 2012-05-09 NOTE — Progress Notes (Signed)
Subjective:  Status post 4 weeks following Cotton osteotomy with bone graft and Percutaneous Tendo Achilles Lengthening right. Patient stated that she went to ER yesterday because right foot was hurting and right leg and arm was bruising, on right deltoid and right posterior calf on surface, with palpable hardness over skin, plus pain and purple discoloration. ER did not know what it was. She does not recall falling at all.   Objective:  Last X-ray reviewed. May benefit by removing the graft. Reviewed last Xray and the poor tolerance to the graft.  Suggested to have the graft removed. Explained the cast can be removed at the time of surgery. Patient wanted to have the cast removed and do not put it back on.  Cast on right foot and leg was removed. Surgical site inspected and found to be in normal color with mild forefoot edema distal to the incision.  Assessment:  Poor tolerance to bone graft. Possible displaced graft to lateral joint space.   Plan:  Patient agreed to have the graft removed but at different surgery center since she believes she had complication due to anesthesia.  In order to have it done at [facility] out patient surgery center, patient is to obtain medical clearance from her PCP. Patient will contact her PCP and will inform us once she gets the clearance.

## 2012-05-10 ENCOUNTER — Encounter (HOSPITAL_BASED_OUTPATIENT_CLINIC_OR_DEPARTMENT_OTHER): Payer: Self-pay | Admitting: *Deleted

## 2012-05-10 ENCOUNTER — Emergency Department (HOSPITAL_BASED_OUTPATIENT_CLINIC_OR_DEPARTMENT_OTHER): Payer: Self-pay

## 2012-05-10 ENCOUNTER — Emergency Department (HOSPITAL_BASED_OUTPATIENT_CLINIC_OR_DEPARTMENT_OTHER)
Admission: EM | Admit: 2012-05-10 | Discharge: 2012-05-10 | Disposition: A | Discharge status: Home / Self Care | Payer: Self-pay | Attending: Emergency Medicine | Admitting: Emergency Medicine

## 2012-05-10 ENCOUNTER — Other Ambulatory Visit: Payer: Self-pay

## 2012-05-10 DIAGNOSIS — Z8739 Personal history of other diseases of the musculoskeletal system and connective tissue: Secondary | ICD-10-CM | POA: Insufficient documentation

## 2012-05-10 DIAGNOSIS — M79609 Pain in unspecified limb: Secondary | ICD-10-CM | POA: Insufficient documentation

## 2012-05-10 DIAGNOSIS — Z8669 Personal history of other diseases of the nervous system and sense organs: Secondary | ICD-10-CM | POA: Insufficient documentation

## 2012-05-10 DIAGNOSIS — M79605 Pain in left leg: Secondary | ICD-10-CM

## 2012-05-10 DIAGNOSIS — M7989 Other specified soft tissue disorders: Secondary | ICD-10-CM | POA: Insufficient documentation

## 2012-05-10 DIAGNOSIS — Z9889 Other specified postprocedural states: Secondary | ICD-10-CM | POA: Insufficient documentation

## 2012-05-10 LAB — CBC WITH DIFFERENTIAL/PLATELET
Basophils Absolute: 0 10*3/uL (ref 0.0–0.1)
Basophils Relative: 0 % (ref 0–1)
Eosinophils Absolute: 0.2 10*3/uL (ref 0.0–0.7)
MCH: 27.3 pg (ref 26.0–34.0)
MCHC: 33.1 g/dL (ref 30.0–36.0)
Neutrophils Relative %: 67 % (ref 43–77)
Platelets: 321 10*3/uL (ref 150–400)

## 2012-05-10 LAB — BASIC METABOLIC PANEL
BUN: 15 mg/dL (ref 6–23)
GFR calc Af Amer: 80 mL/min — ABNORMAL LOW (ref >90)
GFR calc non Af Amer: 69 mL/min — ABNORMAL LOW (ref >90)
Potassium: 3.5 mEq/L (ref 3.5–5.1)
Sodium: 141 mEq/L (ref 135–145)

## 2012-05-10 LAB — TROPONIN I: Troponin I: <0.3 ng/mL (ref <0.30)

## 2012-05-10 NOTE — ED Notes (Signed)
Patient requested Ace wrap because she stated "that's what she had when she came in". I asked PA same, then applied wrap to patient's satisfaction. I also removed i.v. Per PA and advised patient to get dressed. Ready for discharge.

## 2012-05-10 NOTE — ED Provider Notes (Signed)
History     CSN: [id number]  Arrival date & time 05/10/12  1715   First MD Initiated Contact with Patient 05/10/12 1749      Chief Complaint  Patient presents with  . Leg Pain    (Consider location/radiation/quality/duration/timing/severity/associated sxs/prior treatment) HPI Comments: Patient presents to the emergency department with chief complaint of right leg pain. She states that she recently had surgery on March 3. She has been complaining of right leg pain ever since. She also complains of left arm swelling, which started today.  She states that her pain is moderate.  She has tried taking oxycodone for her pain.  She denies any fevers, chills, nausea, or vomiting. Denies any signs of infection. She denies chest pain and shortness of breath.  The history is provided by the patient. No language interpreter was used.    Past Medical History  Diagnosis Date  . Back pain   . Achilles tendonitis 10/2010  . Ankle deformity 10/2010  . Peripheral neuropathy 10/2010    Secondary to Lumbar Radiculopathy  . Lumbar radiculopathy 10/2010    Past Surgical History  Procedure Laterality Date  . [place] bunionectomy Left 2003  . Cotton osteotomy w/ graft Left 04/10/12  . Tal lengthening Left 04/11/22  . Nail matrixectomy Left 04/10/12  . Foot surgery      History reviewed. No pertinent family history.  History  Substance Use Topics  . Smoking status: Never Smoker   . Smokeless tobacco: Not on file  . Alcohol Use: No    OB History   Grav Para Term Preterm Abortions TAB SAB Ect Mult Living                  Review of Systems  All other systems reviewed and are negative.    Allergies  Demerol and Penicillins  Home Medications   Current Outpatient Rx  Name  Route  Sig  Dispense  Refill  . aspirin 325 MG tablet   Oral   Take 1 tablet (325 mg total) by mouth daily.   30 tablet   0   . diphenhydrAMINE (BENADRYL) 25 MG tablet   Oral   Take 1 tablet (25 mg total) by mouth  every 6 (six) hours.   20 tablet   0   . erythromycin (ERY-TAB) 333 MG EC tablet   Oral   Take 1 tablet (333 mg total) by mouth 3 (three) times daily.   30 tablet   0   . hydrocortisone cream 1 %      Apply to affected area 3 times daily   15 g   0   . oxyCODONE (OXYCONTIN) 10 MG 12 hr tablet   Oral   Take 1 tablet (10 mg total) by mouth every 12 (twelve) hours.   60 tablet   0     BP 125/83  Pulse 78  Temp(Src) 98.5 F (36.9 C) (Oral)  Resp 20  Ht 5\' 7"  (1.702 m)  Wt 202 lb (91.627 kg)  BMI 31.63 kg/m2  Physical Exam  Nursing note and vitals reviewed. Constitutional: She is oriented to person, place, and time. She appears well-developed and well-nourished.  HENT:  Head: Normocephalic and atraumatic.  Eyes: Conjunctivae and EOM are normal. Pupils are equal, round, and reactive to light.  Neck: Normal range of motion. Neck supple.  Cardiovascular: Normal rate and regular rhythm.  Exam reveals no gallop and no friction rub.   No murmur heard. Pulmonary/Chest: Effort normal and breath sounds  normal. No respiratory distress. She has no wheezes. She has no rales. She exhibits no tenderness.  Abdominal: Soft. Bowel sounds are normal. She exhibits no distension and no mass. There is no tenderness. There is no rebound and no guarding.  Musculoskeletal: Normal range of motion. She exhibits no edema and no tenderness.  Left forearm mildly swollen, no evidence of infection, cellulitis, or traumatic injury  Left ankle, with post surgical scar, no evidence of infection, or cellulitis. No drainage.  Neurological: She is alert and oriented to person, place, and time.  Skin: Skin is warm and dry.  Psychiatric: She has a normal mood and affect. Her behavior is normal. Judgment and thought content normal.    ED Course  Procedures (including critical care time)  Labs Reviewed  D-DIMER, QUANTITATIVE - Abnormal; Notable for the following:    D-Dimer, Quant 1.00 (*)    All other  components within normal limits  BASIC METABOLIC PANEL - Abnormal; Notable for the following:    Glucose, Bld 107 (*)    GFR calc non Af Amer 69 (*)    GFR calc Af Amer 80 (*)    All other components within normal limits  CBC WITH DIFFERENTIAL  TROPONIN I   ED ECG REPORT  I personally interpreted this EKG   Date: 05/10/2012   Rate: 68  Rhythm: normal sinus rhythm  QRS Axis: normal  Intervals: normal  ST/T Wave abnormalities: nonspecific T wave changes  Conduction Disutrbances:none  Narrative Interpretation:   Old EKG Reviewed: changes noted, non-specific t-wave changes    Dg Chest 2 View  05/10/2012  *RADIOLOGY REPORT*  Clinical Data: Short of breath  CHEST - 2 VIEW  Comparison: Prior chest x-ray 11/03/2010; prior CTA PE study 04/21/2012  Findings: The lungs are well-aerated and free from pulmonary edema, focal airspace consolidation or pulmonary nodule.  Stable borderline cardiomegaly.  No pneumothorax, or pleural effusion. No acute osseous findings.  IMPRESSION:  No acute cardiopulmonary disease.   Original Report Authenticated By: Malachy Moan, M.D.    Dg Forearm Left  05/10/2012  *RADIOLOGY REPORT*  Clinical Data: Lady and left arm pain, bruise  LEFT FOREARM - 2 VIEW  Comparison: None.  Findings: No acute fracture or malalignment.  Normal bony mineralization.  No focal soft tissue abnormality.  The visualized portions of the wrist are unremarkable.  IMPRESSION: Negative radiographs the left forearm.   Original Report Authenticated By: Malachy Moan, M.D.      1. Leg pain, left   2. Arm swelling       MDM  Patient originally complains of leg pain, but also complains of left arm swelling, and concern for blood clot.  D-dimer is elevated, likely due to recent surgery, doubt PE, no active chest pain or shortness of breath, and no tachycardia.    Patient is very anxious and has complaints throughout multiple body systems.  On exam, the patient is well, and not in any  apparent distress.  I have ordered basically labs.  I doubt ACS as the patient is not SOB or having chest pain.  Doubt infection as white count is not elevated nor does she have any fever.    I suspect that the swwelling in her arm is an allergy, as it has responded to benadryl, however, I will order an [place] to rule DVT.  Discussed the patient's workup and findings with Dr. Fredderick Phenix, who agrees with the treatment plan and discharge.   8:51 PM Patient discussed with Dr. Fredderick Phenix, who  agrees with the plan.     Roxy Horseman, PA-C 05/10/12 2352

## 2012-05-10 NOTE — ED Notes (Signed)
Pt states she had surgery Mar 3. Has had several casts removed. Last one Friday. Also c/o H/A.

## 2012-05-10 NOTE — ED Notes (Signed)
I applied large kerlix wrap secured with tape.

## 2012-05-11 NOTE — ED Provider Notes (Signed)
Medical screening examination/treatment/procedure(s) were performed by non-physician practitioner and as supervising physician I was immediately available for consultation/collaboration.   Rhys Lichty, MD 05/11/12 1522 

## 2012-05-15 ENCOUNTER — Telehealth: Payer: Self-pay | Admitting: Podiatry

## 2012-05-15 NOTE — Telephone Encounter (Signed)
Dr. Raynald Kemp, Mrs. Maxham called today am, saying her Lt great toe is still numb(surgery Toe). Other toes are numb also at times. Still in pain. Patient states she is keeping foot elevated.

## 2012-05-16 ENCOUNTER — Ambulatory Visit (INDEPENDENT_AMBULATORY_CARE_PROVIDER_SITE_OTHER): Payer: Medicaid Other | Admitting: Podiatry

## 2012-05-16 VITALS — BP 148/82 | HR 56

## 2012-05-16 DIAGNOSIS — Z9889 Other specified postprocedural states: Secondary | ICD-10-CM

## 2012-05-16 DIAGNOSIS — M25579 Pain in unspecified ankle and joints of unspecified foot: Secondary | ICD-10-CM | POA: Insufficient documentation

## 2012-05-16 DIAGNOSIS — M25572 Pain in left ankle and joints of left foot: Secondary | ICD-10-CM

## 2012-05-16 MED ORDER — OXYCODONE HCL 10 MG PO TABS: 10.0000 mg | ORAL_TABLET | Freq: Four times a day (QID) | ORAL | Status: DC | PRN

## 2012-05-16 MED ORDER — OXYCODONE-ACETAMINOPHEN 10-325 MG PO TABS: 1.0000 | ORAL_TABLET | Freq: Four times a day (QID) | ORAL | Status: DC | PRN

## 2012-05-16 NOTE — Progress Notes (Signed)
Subjective:  5 weeks since TAL and Cotton osteotomy with graft left foot.  Patient presents stating she had lump on her head with pain on her left foot with swolling, numbness, and dark discoloration.  She went to ER and was examined. Stated that they could not find anything wrong.  Today patient came in wearing CAM walker with left foot wrapped with bandage. Objective:  Upon removal of bandage, incision area is dry without edema or erythema, and no discoloration noted. Assessment:  No abnormal findings during the visit. Reviewed that some pain and swelling is expected post operatively, especially when the foot is not immobilized in cast in first 6 weeks of surgery. And some numbness on forefoot can be normal post op occurrence.  Plan:  Patient was to contact her primary care physician to get clearance, but has not done yet. She has appointment at the end of this month, which would be 8 weeks post op.  It might not be best time to have graft removed by then.  Patient was encouraged to contact another physician to get clearance sooner.  Medication re-ordered since last prescription was rejected by pharmacy.

## 2012-05-17 ENCOUNTER — Emergency Department (HOSPITAL_BASED_OUTPATIENT_CLINIC_OR_DEPARTMENT_OTHER): Payer: Self-pay

## 2012-05-17 ENCOUNTER — Encounter (HOSPITAL_BASED_OUTPATIENT_CLINIC_OR_DEPARTMENT_OTHER): Payer: Self-pay | Admitting: *Deleted

## 2012-05-17 ENCOUNTER — Emergency Department (HOSPITAL_BASED_OUTPATIENT_CLINIC_OR_DEPARTMENT_OTHER)
Admission: EM | Admit: 2012-05-17 | Discharge: 2012-05-17 | Disposition: A | Discharge status: Home / Self Care | Payer: Self-pay | Attending: Emergency Medicine | Admitting: Emergency Medicine

## 2012-05-17 DIAGNOSIS — M79609 Pain in unspecified limb: Secondary | ICD-10-CM | POA: Insufficient documentation

## 2012-05-17 DIAGNOSIS — Z8739 Personal history of other diseases of the musculoskeletal system and connective tissue: Secondary | ICD-10-CM | POA: Insufficient documentation

## 2012-05-17 DIAGNOSIS — Z9889 Other specified postprocedural states: Secondary | ICD-10-CM | POA: Insufficient documentation

## 2012-05-17 DIAGNOSIS — Z880 Allergy status to penicillin: Secondary | ICD-10-CM | POA: Insufficient documentation

## 2012-05-17 DIAGNOSIS — G8929 Other chronic pain: Secondary | ICD-10-CM | POA: Insufficient documentation

## 2012-05-17 DIAGNOSIS — M25572 Pain in left ankle and joints of left foot: Secondary | ICD-10-CM

## 2012-05-17 DIAGNOSIS — Z8669 Personal history of other diseases of the nervous system and sense organs: Secondary | ICD-10-CM | POA: Insufficient documentation

## 2012-05-17 DIAGNOSIS — IMO0002 Reserved for concepts with insufficient information to code with codable children: Secondary | ICD-10-CM | POA: Insufficient documentation

## 2012-05-17 DIAGNOSIS — Z7982 Long term (current) use of aspirin: Secondary | ICD-10-CM | POA: Insufficient documentation

## 2012-05-17 DIAGNOSIS — M25579 Pain in unspecified ankle and joints of unspecified foot: Secondary | ICD-10-CM | POA: Insufficient documentation

## 2012-05-17 NOTE — ED Provider Notes (Signed)
History     CSN: [id number]  Arrival date & time 05/17/12  1316   First MD Initiated Contact with Patient 05/17/12 1350      Chief Complaint  Patient presents with  . Foot Pain    (Consider location/radiation/quality/duration/timing/severity/associated sxs/prior treatment) Patient is a 62 y.o. female presenting with lower extremity pain. The history is provided by the patient.  Foot Pain   Patient here with left foot pain has been chronic since she had surgery 3 months ago. No new injury. Denies any drainage or fever. No medications used for this. Symptoms are persistent with ambulation better characterized as sharp. Has been seen by her podiatrist but has been unhappy with the treatment Past Medical History  Diagnosis Date  . Back pain   . Achilles tendonitis 10/2010  . Ankle deformity 10/2010  . Peripheral neuropathy 10/2010    Secondary to Lumbar Radiculopathy  . Lumbar radiculopathy 10/2010    Past Surgical History  Procedure Laterality Date  . [place] bunionectomy Left 2003  . Cotton osteotomy w/ graft Left 04/10/12  . Tal lengthening Left 04/11/22  . Nail matrixectomy Left 04/10/12  . Foot surgery      History reviewed. No pertinent family history.  History  Substance Use Topics  . Smoking status: Never Smoker   . Smokeless tobacco: Not on file  . Alcohol Use: No    OB History   Grav Para Term Preterm Abortions TAB SAB Ect Mult Living                  Review of Systems  All other systems reviewed and are negative.    Allergies  Demerol and Penicillins  Home Medications   Current Outpatient Rx  Name  Route  Sig  Dispense  Refill  . aspirin 325 MG tablet   Oral   Take 1 tablet (325 mg total) by mouth daily.   30 tablet   0   . diphenhydrAMINE (BENADRYL) 25 MG tablet   Oral   Take 1 tablet (25 mg total) by mouth every 6 (six) hours.   20 tablet   0   . erythromycin (ERY-TAB) 333 MG EC tablet   Oral   Take 1 tablet (333 mg total) by mouth 3  (three) times daily.   30 tablet   0   . hydrocortisone cream 1 %      Apply to affected area 3 times daily   15 g   0   . Oxycodone HCl 10 MG TABS   Oral   Take 1 tablet (10 mg total) by mouth 4 (four) times daily as needed.   60 tablet   0     BP 124/75  Pulse 75  Temp(Src) 98.6 F (37 C) (Oral)  Resp 18  Ht 5\' 7"  (1.702 m)  Wt 201 lb (91.173 kg)  BMI 31.47 kg/m2  SpO2 98%  Physical Exam  Nursing note and vitals reviewed. Constitutional: She is oriented to person, place, and time. She appears well-developed and well-nourished.  Non-toxic appearance.  HENT:  Head: Normocephalic and atraumatic.  Eyes: Conjunctivae are normal. Pupils are equal, round, and reactive to light.  Neck: Normal range of motion.  Cardiovascular: Normal rate.   Pulmonary/Chest: Effort normal.  Musculoskeletal:       Feet:  Neurological: She is alert and oriented to person, place, and time.  Skin: Skin is warm and dry.  Psychiatric: She has a normal mood and affect.    ED Course  Procedures (including critical care time)  Labs Reviewed - No data to display No results found.   No diagnosis found.    MDM  Pt with negative x-ray--stable for d/c        Toy Baker, MD 05/17/12 1521

## 2012-05-17 NOTE — ED Notes (Signed)
Pt states she had surgery on her left foot (by Dr. Raynald Kemp) 3 months ago. Pt has had problems since.

## 2012-05-21 ENCOUNTER — Encounter: Payer: Self-pay | Admitting: Podiatry

## 2012-05-21 ENCOUNTER — Ambulatory Visit (INDEPENDENT_AMBULATORY_CARE_PROVIDER_SITE_OTHER): Payer: Medicaid Other | Admitting: Podiatry

## 2012-05-21 VITALS — BP 124/67 | HR 77

## 2012-05-21 DIAGNOSIS — Z9889 Other specified postprocedural states: Secondary | ICD-10-CM

## 2012-05-21 MED ORDER — ZOLPIDEM TARTRATE 5 MG PO TABS: 5.0000 mg | ORAL_TABLET | Freq: Every evening | ORAL | Status: DC | PRN

## 2012-05-21 NOTE — Progress Notes (Signed)
Subjective:  Patient presents for follow up left foot surgery. On 04/10/12, had done TAL, Cotton osteotomy with bone graft left, Excision nail and matrix lateral border left hallux. Stated that she went to ER on 05/17/12 because her foot was swollen and feel numb. Patient requests for sleeping pill. States that she is loosing her mind from not sleeping well. Her foot worries her and prevents her from sleeping. Patient did not want to wait till she sees her PCP next week. Patient was adamant about getting her sleeping pills.   Objective:  Slight increase in warmth in left foot compared with contralateral side.  Noted of no edema or erythema associated with surgical site.  Good range of motion with maintained correction.  Incision area is dry and well coapted.  Hallux lateral border is dry and hard scab formed over the border.   Assessment:  6 weeks post surgical foot pain consistent with surgery performed.  Plan:  Patient believes something is wrong with her foot to cause so much pain.  It was explained that the graft might incorporate with the bone and may not need be removed since we waited this long.  Patient wants the graft removed regardless. Wants to have procedure done at another facility.  Will make arrangement as soon as we get medical clearance from her PCP. Rx. Ambien 5mg  #15 written.

## 2012-05-28 ENCOUNTER — Telehealth: Payer: Self-pay | Admitting: Podiatry

## 2012-05-28 ENCOUNTER — Other Ambulatory Visit: Payer: Self-pay | Admitting: Podiatry

## 2012-05-28 MED ORDER — OXYCODONE HCL 10 MG PO TABS: 10.0000 mg | ORAL_TABLET | Freq: Four times a day (QID) | ORAL | Status: DC | PRN

## 2012-06-06 ENCOUNTER — Emergency Department (HOSPITAL_COMMUNITY)
Admission: EM | Admit: 2012-06-06 | Discharge: 2012-06-06 | Discharge status: Against Medical Advice | Payer: Self-pay | Attending: Emergency Medicine | Admitting: Emergency Medicine

## 2012-06-06 DIAGNOSIS — Z5320 Procedure and treatment not carried out because of patient's decision for unspecified reasons: Secondary | ICD-10-CM | POA: Insufficient documentation

## 2012-06-06 NOTE — ED Notes (Signed)
Pt called for triage x2 no answer. 

## 2012-06-08 ENCOUNTER — Ambulatory Visit (HOSPITAL_COMMUNITY): Payer: Self-pay | Attending: Emergency Medicine

## 2012-06-08 ENCOUNTER — Encounter (HOSPITAL_COMMUNITY): Payer: Self-pay | Admitting: *Deleted

## 2012-06-08 ENCOUNTER — Emergency Department (HOSPITAL_COMMUNITY)
Admission: EM | Admit: 2012-06-08 | Discharge: 2012-06-08 | Disposition: A | Discharge status: Home / Self Care | Payer: Self-pay | Attending: Emergency Medicine | Admitting: Emergency Medicine

## 2012-06-08 DIAGNOSIS — Z8669 Personal history of other diseases of the nervous system and sense organs: Secondary | ICD-10-CM | POA: Insufficient documentation

## 2012-06-08 DIAGNOSIS — Z8739 Personal history of other diseases of the musculoskeletal system and connective tissue: Secondary | ICD-10-CM | POA: Insufficient documentation

## 2012-06-08 DIAGNOSIS — Z79899 Other long term (current) drug therapy: Secondary | ICD-10-CM | POA: Insufficient documentation

## 2012-06-08 DIAGNOSIS — M7989 Other specified soft tissue disorders: Secondary | ICD-10-CM | POA: Insufficient documentation

## 2012-06-08 DIAGNOSIS — M25475 Effusion, left foot: Secondary | ICD-10-CM

## 2012-06-08 DIAGNOSIS — Z9889 Other specified postprocedural states: Secondary | ICD-10-CM | POA: Insufficient documentation

## 2012-06-08 LAB — CBC
HCT: 38.6 % (ref 36.0–46.0)
MCHC: 33.2 g/dL (ref 30.0–36.0)
MCV: 81.8 fL (ref 78.0–100.0)
RDW: 13.6 % (ref 11.5–15.5)

## 2012-06-08 LAB — POCT I-STAT, CHEM 8
Calcium, Ion: 1.2 mmol/L (ref 1.12–1.23)
Glucose, Bld: 112 mg/dL — ABNORMAL HIGH (ref 70–99)
HCT: 40 % (ref 36.0–46.0)
Hemoglobin: 13.6 g/dL (ref 12.0–15.0)
Potassium: 3.7 mEq/L (ref 3.5–5.1)

## 2012-06-08 MED ORDER — HYDROMORPHONE HCL PF 1 MG/ML IJ SOLN
1.0000 mg | Freq: Once | INTRAMUSCULAR | Status: AC
  Administered 2012-06-08: 1 mg via INTRAMUSCULAR
  Filled 2012-06-08: qty 1

## 2012-06-08 MED ORDER — HYDROCODONE-ACETAMINOPHEN 5-325 MG PO TABS: 2.0000 | ORAL_TABLET | Freq: Four times a day (QID) | ORAL | Status: DC | PRN

## 2012-06-08 MED ORDER — ENOXAPARIN SODIUM 100 MG/ML ~~LOC~~ SOLN
90.0000 mg | Freq: Once | SUBCUTANEOUS | Status: AC
  Administered 2012-06-08: 90 mg via SUBCUTANEOUS
  Filled 2012-06-08: qty 1

## 2012-06-08 MED ORDER — ONDANSETRON 4 MG PO TBDP
4.0000 mg | ORAL_TABLET | Freq: Once | ORAL | Status: AC
  Administered 2012-06-08: 4 mg via ORAL
  Filled 2012-06-08: qty 1

## 2012-06-08 NOTE — ED Notes (Signed)
The pt is here for lt foot swelling.  She had foot surgery on that foot in April and since then the swelling has been intermittent.  She does not know what type surgery she had.  She has been seen by that foot surgeon and she may have to have surgery again.

## 2012-06-08 NOTE — ED Notes (Signed)
Pt feeling nauseated

## 2012-06-08 NOTE — ED Notes (Signed)
Pt states understanding of discharge instructions 

## 2012-06-08 NOTE — ED Provider Notes (Signed)
History     CSN: [id number]  Arrival date & time 06/08/12  0134   First MD Initiated Contact with Patient 06/08/12 [phone]      Chief Complaint  Patient presents with  . feet swelling     (Consider location/radiation/quality/duration/timing/severity/associated sxs/prior treatment) HPI History is provided by the patient. Left foot pain and swelling since her surgery on 03/10/2012. At that time she was seen a podiatrist who recommended surgery and patient states that ever since her surgery she is having persistent pain and swelling. Her diet is recommended that she have another surgery and she is currently considering this. She has had multiple ED visits for the same symptoms, has been taking pain medications without relief, and tonight expresses frustration with her situation. Her pain is sharp in quality moderate in severity located all over her left foot. She has persistent swelling located in her calf and affecting her feet. She has not had any fevers or rash. No trauma. Hurts to walk on her foot. No known alleviating factors. Past Medical History  Diagnosis Date  . Back pain   . Achilles tendonitis 10/2010  . Ankle deformity 10/2010  . Peripheral neuropathy 10/2010    Secondary to Lumbar Radiculopathy  . Lumbar radiculopathy 10/2010    Past Surgical History  Procedure Laterality Date  . [place] bunionectomy Left 2003  . Cotton osteotomy w/ graft Left 04/10/12  . Tal lengthening Left 04/11/22  . Nail matrixectomy Left 04/10/12  . Foot surgery      No family history on file.  History  Substance Use Topics  . Smoking status: Never Smoker   . Smokeless tobacco: Not on file  . Alcohol Use: No    OB History   Grav Para Term Preterm Abortions TAB SAB Ect Mult Living                  Review of Systems  Constitutional: Negative for fever and chills.  HENT: Negative for neck pain and neck stiffness.   Eyes: Negative for pain.  Respiratory: Negative for shortness of breath.    Cardiovascular: Positive for leg swelling. Negative for chest pain.  Gastrointestinal: Negative for abdominal pain.  Genitourinary: Negative for dysuria.  Musculoskeletal: Negative for back pain.  Skin: Negative for rash.  Neurological: Negative for headaches.  All other systems reviewed and are negative.    Allergies  Demerol and Penicillins  Home Medications   Current Outpatient Rx  Name  Route  Sig  Dispense  Refill  . Oxycodone HCl 10 MG TABS   Oral   Take 10 mg by mouth 4 (four) times daily as needed (pain).         Marland Kitchen zolpidem (AMBIEN) 5 MG tablet   Oral   Take 1 tablet (5 mg total) by mouth at bedtime as needed for sleep.   15 tablet   0     BP 128/69  Pulse 75  Temp(Src) 98.4 F (36.9 C) (Oral)  Resp 16  Physical Exam  Constitutional: She is oriented to person, place, and time. She appears well-developed and well-nourished.  HENT:  Head: Normocephalic and atraumatic.  Eyes: EOM are normal. Pupils are equal, round, and reactive to light.  Neck: Neck supple.  Cardiovascular: Regular rhythm and intact distal pulses.   Pulmonary/Chest: Effort normal. No respiratory distress.  Musculoskeletal: Normal range of motion.  Left lower extremity with swelling in the lower leg and foot. There is a well-healed surgical scar over the dorsum of the foot.  No erythema or increased warmth to touch. No fluctuance. Distal neurovascular intact. No cords and negative Homans sign  Neurological: She is alert and oriented to person, place, and time.  Skin: Skin is warm and dry.    ED Course  Procedures (including critical care time)  Results for orders placed during the hospital encounter of 06/08/12  CBC      Result Value Range   WBC 8.6  4.0 - 10.5 K/uL   RBC 4.72  3.87 - 5.11 MIL/uL   Hemoglobin 12.8  12.0 - 15.0 g/dL   HCT 08.6  57.8 - 46.9 %   MCV 81.8  78.0 - 100.0 fL   MCH 27.1  26.0 - 34.0 pg   MCHC 33.2  30.0 - 36.0 g/dL   RDW 62.9  52.8 - 41.3 %   Platelets  309  150 - 400 K/uL  POCT I-STAT, CHEM 8      Result Value Range   Sodium 144  135 - 145 mEq/L   Potassium 3.7  3.5 - 5.1 mEq/L   Chloride 107  96 - 112 mEq/L   BUN 16  6 - 23 mg/dL   Creatinine, Ser 2.44  0.50 - 1.10 mg/dL   Glucose, Bld 010 (*) 70 - 99 mg/dL   Calcium, Ion 2.72  1.12 - 1.23 mmol/L   TCO2 27  0 - 100 mmol/L   Hemoglobin 13.6  12.0 - 15.0 g/dL   HCT 53.6  64.4 - 03.4 %    IM Dilaudid provided  6:15 AM pain significantly improved and patient feels comfortable with plan discharge home, followup ultrasound, followup podiatrist. Prescriptions provided with strict return precautions verbalized as understood   MDM  Persistent left foot / leg swelling. Evaluated with labs reviewed as above.   Lovenox provided/ ultrasound ordered.   Vital signs and nursing notes reviewed and considered    Sunnie Nielsen, MD 06/08/12 [phone]

## 2012-06-08 NOTE — ED Notes (Signed)
Manus Rudd FNP at bedside.

## 2012-06-08 NOTE — ED Notes (Signed)
Opitz MD at bedside. 

## 2012-06-09 ENCOUNTER — Other Ambulatory Visit (HOSPITAL_COMMUNITY): Payer: Self-pay | Admitting: Internal Medicine

## 2012-06-09 ENCOUNTER — Encounter: Payer: Self-pay | Admitting: Podiatry

## 2012-06-09 ENCOUNTER — Ambulatory Visit (INDEPENDENT_AMBULATORY_CARE_PROVIDER_SITE_OTHER): Payer: Medicaid Other | Admitting: Podiatry

## 2012-06-09 ENCOUNTER — Ambulatory Visit (HOSPITAL_COMMUNITY)
Admission: RE | Admit: 2012-06-09 | Discharge: 2012-06-09 | Disposition: A | Discharge status: Home / Self Care | Payer: Self-pay | Source: Ambulatory Visit | Attending: Emergency Medicine | Admitting: Emergency Medicine

## 2012-06-09 DIAGNOSIS — M7989 Other specified soft tissue disorders: Secondary | ICD-10-CM

## 2012-06-09 DIAGNOSIS — M25562 Pain in left knee: Secondary | ICD-10-CM

## 2012-06-09 DIAGNOSIS — M79609 Pain in unspecified limb: Secondary | ICD-10-CM | POA: Insufficient documentation

## 2012-06-09 DIAGNOSIS — M25579 Pain in unspecified ankle and joints of unspecified foot: Secondary | ICD-10-CM

## 2012-06-09 DIAGNOSIS — M25572 Pain in left ankle and joints of left foot: Secondary | ICD-10-CM

## 2012-06-09 MED ORDER — OXYCODONE HCL 10 MG PO TABS: 10.0000 mg | ORAL_TABLET | Freq: Four times a day (QID) | ORAL | Status: DC | PRN

## 2012-06-09 NOTE — Progress Notes (Signed)
Previously patient had informed us that she had appointment with her primary care physician in May 27th. We were to remove the bone graft on her right foot as soon as she gets the medical clearance. Now patient informed us that her appointment is set for June 27th.  Stated that her foot got swollen much and went to ER. She had Ultrasound to rule out blood clot.  The result came in as negative.   Patient still feels sharp pain from inside of the nail where nail root was taken out. Her primary care physisian is in [place], and patient does not know the name.  Objective: On surface of left great toe nail lateral border is clean, dry, without any color change.  The left foot has raised bump at bone graft site. Minimum soft tissue edema and no temperature change was noted at the painful area of the left foot.  No visible erythema associated with the pain noted.  Assessment: Suspected displaced bone graft. Pain and swelling at surgery site, dorsum left foot.  Plan: Explained patient we are waiting on her to get her medical clearance so that we can remove the graft, and hope that will resolve her problem.  Remove bone graft as soon as patient gets medical clearance, which is required to have prior to the surgery at the location where patient requested. Rx. Given as requested.

## 2012-06-09 NOTE — Progress Notes (Signed)
Left lower extremity venous duplex:  No evidence of DVT, superficial thrombosis, or Baker's cyst.  Right:  Negative for DVT in the common femoral vein.  

## 2012-07-04 ENCOUNTER — Telehealth: Payer: Self-pay | Admitting: Podiatry

## 2012-07-04 MED ORDER — OXYCODONE HCL 10 MG PO TABS: 10.0000 mg | ORAL_TABLET | Freq: Four times a day (QID) | ORAL | Status: DC | PRN

## 2012-07-04 NOTE — Telephone Encounter (Signed)
Dr. Raynald Kemp , Mrs. Diehl called this am, request her pain medication be refilled.

## 2012-08-26 ENCOUNTER — Emergency Department (HOSPITAL_BASED_OUTPATIENT_CLINIC_OR_DEPARTMENT_OTHER)
Admission: EM | Admit: 2012-08-26 | Discharge: 2012-08-27 | Disposition: A | Discharge status: Home / Self Care | Payer: Medicaid Other | Attending: Emergency Medicine | Admitting: Emergency Medicine

## 2012-08-26 ENCOUNTER — Encounter (HOSPITAL_BASED_OUTPATIENT_CLINIC_OR_DEPARTMENT_OTHER): Payer: Self-pay | Admitting: *Deleted

## 2012-08-26 DIAGNOSIS — Z8669 Personal history of other diseases of the nervous system and sense organs: Secondary | ICD-10-CM | POA: Insufficient documentation

## 2012-08-26 DIAGNOSIS — M5430 Sciatica, unspecified side: Secondary | ICD-10-CM | POA: Insufficient documentation

## 2012-08-26 DIAGNOSIS — Z880 Allergy status to penicillin: Secondary | ICD-10-CM | POA: Insufficient documentation

## 2012-08-26 DIAGNOSIS — Z8739 Personal history of other diseases of the musculoskeletal system and connective tissue: Secondary | ICD-10-CM | POA: Insufficient documentation

## 2012-08-26 NOTE — ED Notes (Signed)
Pt c/o lower back pain which radiates down left leg x 8 hrs

## 2012-08-27 ENCOUNTER — Encounter (HOSPITAL_BASED_OUTPATIENT_CLINIC_OR_DEPARTMENT_OTHER): Payer: Self-pay | Admitting: Emergency Medicine

## 2012-08-27 NOTE — ED Notes (Signed)
Pt able to get in and out of wheelchair without assistance, pt lifted legs without realizing that no one was assisting her, able to ambulate to toilet 10 steps without assistance

## 2012-08-27 NOTE — ED Provider Notes (Addendum)
CSN: [id number]     Arrival date & time 08/26/12  2325 History     First MD Initiated Contact with Patient 08/27/12 0155     Chief Complaint  Patient presents with  . Back Pain   (Consider location/radiation/quality/duration/timing/severity/associated sxs/prior Treatment) HPI This is a 62 year old female who is on chronic pain medication for left foot pain. She is here with lower back pain that began when she awoke from a nap yesterday afternoon. The pain is moderate to severe. It is located across her lower lumbar area and radiates to both buttocks. She denies any neurologic changes. She denies any bowel or bladder changes. She states it is worse when walking and is making walking difficult, although nursing staff has noted her ambulating without difficulty in the emergency department. She denies any injury to her lower back.  Past Medical History  Diagnosis Date  . Back pain   . Achilles tendonitis 10/2010  . Ankle deformity 10/2010  . Peripheral neuropathy 10/2010    Secondary to Lumbar Radiculopathy  . Lumbar radiculopathy 10/2010   Past Surgical History  Procedure Laterality Date  . [place] bunionectomy Left 2003  . Cotton osteotomy w/ graft Left 04/10/12  . Tal lengthening Left 04/10/2012  . Nail matrixectomy Left 04/10/12  . Foot surgery     History reviewed. No pertinent family history. History  Substance Use Topics  . Smoking status: Never Smoker   . Smokeless tobacco: Not on file  . Alcohol Use: No   OB History   Grav Para Term Preterm Abortions TAB SAB Ect Mult Living                 Review of Systems  All other systems reviewed and are negative.    Allergies  Demerol and Penicillins  Home Medications   Current Outpatient Rx  Name  Route  Sig  Dispense  Refill  . HYDROcodone-acetaminophen (NORCO/VICODIN) 5-325 MG per tablet   Oral   Take 2 tablets by mouth every 6 (six) hours as needed for pain.   15 tablet   0   . Oxycodone HCl 10 MG TABS   Oral  Take 1 tablet (10 mg total) by mouth 4 (four) times daily as needed (pain).   60 tablet   0   . zolpidem (AMBIEN) 5 MG tablet   Oral   Take 1 tablet (5 mg total) by mouth at bedtime as needed for sleep.   15 tablet   0    BP 160/73  Pulse 65  Temp(Src) 98.6 F (37 C) (Oral)  Resp 18  Ht 5\' 7"  (1.702 m)  Wt 198 lb (89.812 kg)  BMI 31 kg/m2  SpO2 98%  Physical Exam General: Well-developed, well-nourished female in no acute distress; appearance consistent with age of record HENT: normocephalic, atraumatic Eyes: pupils equal round and reactive to light; extraocular muscles intact Neck: supple Heart: regular rate and rhythm Lungs: clear to auscultation bilaterally Abdomen: soft; nondistended; nontender; bowel sounds present Back: Bilateral paralumbar tenderness; bilateral sciatic notch tenderness; no pain on straight leg raise bilaterally Extremities: No deformity; full range of motion; tenderness of left foot without deformity Neurologic: Awake, alert and oriented; motor function intact in all extremities and symmetric; no facial droop Skin: Warm and dry Psychiatric: flat affect    ED Course   Procedures (including critical care time)   MDM  We'll arrange for outpatient MRI and follow up with her primary care physician Dr. Mikeal Hawthorne. She is already on high-dose oxycodone  and has been receiving multiple prescriptions monthly. She states she does not require any additional analgesia.  Hanley Seamen, MD 08/27/12 1610  Hanley Seamen, MD 08/27/12 [id number]

## 2012-08-27 NOTE — ED Notes (Signed)
Pt ambulated 24 ft back to bed from restroom, without difficulty

## 2012-08-27 NOTE — ED Notes (Signed)
Pt denied pain radiation to legs upon original assistance

## 2012-09-09 ENCOUNTER — Ambulatory Visit (HOSPITAL_BASED_OUTPATIENT_CLINIC_OR_DEPARTMENT_OTHER)
Admit: 2012-09-09 | Discharge: 2012-09-09 | Disposition: A | Discharge status: Home / Self Care | Payer: Medicaid Other | Attending: Emergency Medicine | Admitting: Emergency Medicine

## 2012-09-09 DIAGNOSIS — M545 Low back pain, unspecified: Secondary | ICD-10-CM | POA: Insufficient documentation

## 2012-09-09 DIAGNOSIS — Q762 Congenital spondylolisthesis: Secondary | ICD-10-CM | POA: Insufficient documentation

## 2012-09-09 DIAGNOSIS — M47817 Spondylosis without myelopathy or radiculopathy, lumbosacral region: Secondary | ICD-10-CM | POA: Insufficient documentation

## 2012-09-09 DIAGNOSIS — G8929 Other chronic pain: Secondary | ICD-10-CM | POA: Insufficient documentation

## 2012-09-09 DIAGNOSIS — R252 Cramp and spasm: Secondary | ICD-10-CM | POA: Insufficient documentation

## 2012-10-08 ENCOUNTER — Ambulatory Visit (INDEPENDENT_AMBULATORY_CARE_PROVIDER_SITE_OTHER): Payer: BC Managed Care – PPO

## 2012-10-08 DIAGNOSIS — M792 Neuralgia and neuritis, unspecified: Secondary | ICD-10-CM

## 2012-10-08 DIAGNOSIS — IMO0002 Reserved for concepts with insufficient information to code with codable children: Secondary | ICD-10-CM

## 2012-11-04 NOTE — Procedures (Addendum)
   [facility]  NCS (NERVE CONDUCTION STUDY) WITH EMG (ELECTROMYOGRAPHY) REPORT   STUDY DATE: 10/08/12 PATIENT NAME: Kelly Rosales DOB: Dec 05, 1950 MRN: [id number]  ORDERING CLINICIAN: Normal Regal, DPM  TECHNOLOGIST: Kaylyn Lim ELECTROMYOGRAPHER: Glenford Bayley. Penumalli, MD  CLINICAL INFORMATION: 62 year old female with lower extremity pain.  FINDINGS: NERVE CONDUCTION STUDY: Left median motor responses prolonged distal latency (10.4 ms, normal less than or equal to 4.2 seconds) normal amplitude, slow conduction velocity (28 m/s, normal greater than or equal to 48 m/s) and prolonged F-wave latency (48 ms). Left ulnar motor response has prolonged distal latency (6.2 ms), slow conduction velocity (22 m/s below elbow, 24 m/s above the elbow, 26 m/s at Erb's point) and prolonged F-wave latency (56 ms).  Left peroneal motor response could not be obtained with recording over the extensor digitorum brevis. Recording at the tibialis anterior muscle demonstrates normal amplitude and slow conduction velocity (28 m/s).  Right peroneal motor response demonstrates accessory peroneal nerve. There is prolonged distal latency (9.6 ms) decreased amplitude and slow conduction velocity (19 m/s below the knee, 21 m/s above the knee) and prolonged F-wave latency (165 ms).  Bilateral tibial motor responses could not be obtained.  Left median, left ulnar, left radial sensory responses could not be obtained. Right peroneal sensory response could not be obtained. Left peroneal sensory responses decreased amplitude and slow conduction velocity.   NEEDLE ELECTROMYOGRAPHY: Needle EMG was not performed as this was not ordered by referring clinician.   IMPRESSION:  This is an abnormal study demonstrating widespread demyelinating sensorimotor polyneuropathy. Considerations include Chronic Immune Demyelinating Polyneuropathy (CIDP) vs other autoimmune, inflammatory, toxic, metabolic or hereditary  neuropathies.    INTERPRETING PHYSICIAN:  Suanne Marker, MD Certified in Neurology, Neurophysiology and Neuroimaging  [facility] [address], [address] [place], [place] [address] [phone]

## 2013-05-10 ENCOUNTER — Encounter (HOSPITAL_COMMUNITY): Payer: Self-pay | Admitting: Emergency Medicine

## 2013-05-10 ENCOUNTER — Emergency Department (HOSPITAL_COMMUNITY)
Admission: EM | Admit: 2013-05-10 | Discharge: 2013-05-10 | Disposition: A | Discharge status: Home / Self Care | Payer: BC Managed Care – PPO | Attending: Emergency Medicine | Admitting: Emergency Medicine

## 2013-05-10 ENCOUNTER — Emergency Department (HOSPITAL_COMMUNITY): Payer: BC Managed Care – PPO

## 2013-05-10 DIAGNOSIS — S6990XA Unspecified injury of unspecified wrist, hand and finger(s), initial encounter: Secondary | ICD-10-CM | POA: Insufficient documentation

## 2013-05-10 DIAGNOSIS — Y929 Unspecified place or not applicable: Secondary | ICD-10-CM | POA: Insufficient documentation

## 2013-05-10 DIAGNOSIS — S6991XA Unspecified injury of right wrist, hand and finger(s), initial encounter: Secondary | ICD-10-CM

## 2013-05-10 DIAGNOSIS — Z880 Allergy status to penicillin: Secondary | ICD-10-CM | POA: Insufficient documentation

## 2013-05-10 DIAGNOSIS — Z8739 Personal history of other diseases of the musculoskeletal system and connective tissue: Secondary | ICD-10-CM | POA: Insufficient documentation

## 2013-05-10 DIAGNOSIS — W230XXA Caught, crushed, jammed, or pinched between moving objects, initial encounter: Secondary | ICD-10-CM | POA: Insufficient documentation

## 2013-05-10 DIAGNOSIS — Z8669 Personal history of other diseases of the nervous system and sense organs: Secondary | ICD-10-CM | POA: Insufficient documentation

## 2013-05-10 DIAGNOSIS — Y939 Activity, unspecified: Secondary | ICD-10-CM | POA: Insufficient documentation

## 2013-05-10 MED ORDER — IBUPROFEN 800 MG PO TABS: 800.0000 mg | ORAL_TABLET | Freq: Three times a day (TID) | ORAL | Status: DC

## 2013-05-10 NOTE — ED Provider Notes (Signed)
Medical screening examination/treatment/procedure(s) were performed by non-physician practitioner and as supervising physician I was immediately available for consultation/collaboration.   Kathalene Frames, MD 05/10/13 2037

## 2013-05-10 NOTE — Discharge Instructions (Signed)
Take ibuprofen as needed for pain. Rest, ice and elevate your hand. Refer to attached documents for more information.

## 2013-05-10 NOTE — ED Provider Notes (Signed)
CSN: [id number]     Arrival date & time 05/10/13  1837 History  This chart was scribed for non-physician practitioner working with Kathalene Frames, MD by Vernell Barrier, ED scribe. This patient was seen in room WTR7/WTR7 and the patient's care was started at 8:12 PM.   Chief Complaint  Patient presents with  . Hand Injury   Patient is a 63 y.o. female presenting with hand pain. The history is provided by the patient. No language interpreter was used.  Hand Pain This is a new problem. The current episode started 1 to 2 hours ago. The problem has not changed since onset.The symptoms are aggravated by bending. She has tried nothing for the symptoms.  HPI Comments: Kelly Rosales is a 63 y.o. female who presents to the Emergency Department complaining of right hand pain after she slammed it in the car door approximately 1.5 hour ago. Reports inability to move fingers due to pain. Numbness at the tips of the fingers. Has not taken anything for pain yet.  Past Medical History  Diagnosis Date  . Back pain   . Achilles tendonitis 10/2010  . Ankle deformity 10/2010  . Peripheral neuropathy 10/2010    Secondary to Lumbar Radiculopathy  . Lumbar radiculopathy 10/2010   Past Surgical History  Procedure Laterality Date  . Austin bunionectomy Left 2003  . Cotton osteotomy w/ graft Left 04/10/12  . Tal lengthening Left 04/10/2012  . Nail matrixectomy Left 04/10/12  . Foot surgery     No family history on file. History  Substance Use Topics  . Smoking status: Never Smoker   . Smokeless tobacco: Not on file  . Alcohol Use: No   OB History   Grav Para Term Preterm Abortions TAB SAB Ect Mult Living                 Review of Systems  Musculoskeletal: Positive for arthralgias and myalgias.  All other systems reviewed and are negative.  Allergies  Demerol and Penicillins  Home Medications   Prior to Admission medications   Medication Sig Start Date End Date Taking? Authorizing Provider   HYDROcodone-acetaminophen (NORCO/VICODIN) 5-325 MG per tablet Take 2 tablets by mouth every 6 (six) hours as needed for pain. 06/08/12   Teressa Lower, MD  Oxycodone HCl 10 MG TABS Take 1 tablet (10 mg total) by mouth 4 (four) times daily as needed (pain). 07/04/12   Myeong Sheard, DPM  zolpidem (AMBIEN) 5 MG tablet Take 1 tablet (5 mg total) by mouth at bedtime as needed for sleep. 05/21/12   Myeong Sheard, DPM   Triage Vitals: BP 149/89  Pulse 59  Temp(Src) 97.8 F (36.6 C) (Oral)  Resp 16  SpO2 96% Physical Exam  Nursing note and vitals reviewed. Constitutional: She is oriented to person, place, and time. She appears well-developed and well-nourished. No distress.  HENT:  Head: Normocephalic and atraumatic.  Eyes: EOM are normal.  Neck: Neck supple. No tracheal deviation present.  Cardiovascular: Normal rate and intact distal pulses.   Sufficient capillary refill of right hand.   Pulmonary/Chest: Effort normal. No respiratory distress.  Musculoskeletal:  Patient able to make a fist with right hand. Slightly limited ROM of right fingers due to pain. No obvious deformity. Distal right 4th finger tender to palpation with associated bruising.   Neurological: She is alert and oriented to person, place, and time. Coordination normal.  Skin: Skin is warm and dry.  Psychiatric: She has a normal mood and affect. Her  behavior is normal.   ED Course  Procedures  DIAGNOSTIC STUDIES: Oxygen Saturation is 96% on room air, normal by my interpretation.    COORDINATION OF CARE: At 8:11 PM: Discussed treatment plan with patient which includes pain medication and ice for swelling. Patient agrees.   Labs Review Labs Reviewed - No data to display  Imaging Review Dg Hand Complete Right  05/10/2013   CLINICAL DATA:  Right hand slammed in car door, pain of the right hand  EXAM: RIGHT HAND - COMPLETE 3+ VIEW  COMPARISON:  None.  FINDINGS: There is no evidence of fracture or dislocation. There is no  evidence of arthropathy or other focal bone abnormality. Soft tissues are unremarkable.  IMPRESSION: Negative.   Electronically Signed   By: Kathreen Devoid   On: 05/10/2013 19:41    EKG Interpretation None      MDM   Final diagnoses:  Injury of right hand   8:30 PM Xray unremarkable for acute changes. No neurovascular compromise. No other injury. Patient will have ice and ibuprofen for pain. Patient advised to rest, ice and elevate.   I personally performed the services described in this documentation, which was scribed in my presence. The recorded information has been reviewed and is accurate.      Kelly Rosales, [place] 05/10/13 2032

## 2013-05-10 NOTE — ED Notes (Signed)
Pt from home c/o a right hand injury that occurred 1830 today. She shut her right ringer into car door. She states she can't mover her finger's d/t pain.  The tip of finger is number but has feeling else where.

## 2013-08-27 ENCOUNTER — Emergency Department (HOSPITAL_COMMUNITY): Payer: BC Managed Care – PPO

## 2013-08-27 ENCOUNTER — Emergency Department (HOSPITAL_COMMUNITY)
Admission: EM | Admit: 2013-08-27 | Discharge: 2013-08-27 | Disposition: A | Discharge status: Home / Self Care | Payer: BC Managed Care – PPO | Attending: Emergency Medicine | Admitting: Emergency Medicine

## 2013-08-27 ENCOUNTER — Encounter (HOSPITAL_COMMUNITY): Payer: Self-pay | Admitting: Emergency Medicine

## 2013-08-27 DIAGNOSIS — M549 Dorsalgia, unspecified: Secondary | ICD-10-CM

## 2013-08-27 DIAGNOSIS — R059 Cough, unspecified: Secondary | ICD-10-CM | POA: Diagnosis not present

## 2013-08-27 DIAGNOSIS — R071 Chest pain on breathing: Secondary | ICD-10-CM | POA: Insufficient documentation

## 2013-08-27 DIAGNOSIS — R05 Cough: Secondary | ICD-10-CM | POA: Diagnosis not present

## 2013-08-27 DIAGNOSIS — R079 Chest pain, unspecified: Secondary | ICD-10-CM | POA: Diagnosis present

## 2013-08-27 DIAGNOSIS — Z880 Allergy status to penicillin: Secondary | ICD-10-CM | POA: Diagnosis not present

## 2013-08-27 DIAGNOSIS — G8929 Other chronic pain: Secondary | ICD-10-CM | POA: Diagnosis not present

## 2013-08-27 DIAGNOSIS — Z791 Long term (current) use of non-steroidal anti-inflammatories (NSAID): Secondary | ICD-10-CM | POA: Diagnosis not present

## 2013-08-27 DIAGNOSIS — M545 Low back pain, unspecified: Secondary | ICD-10-CM | POA: Insufficient documentation

## 2013-08-27 DIAGNOSIS — Z8669 Personal history of other diseases of the nervous system and sense organs: Secondary | ICD-10-CM | POA: Diagnosis not present

## 2013-08-27 DIAGNOSIS — R0789 Other chest pain: Secondary | ICD-10-CM

## 2013-08-27 DIAGNOSIS — M25519 Pain in unspecified shoulder: Secondary | ICD-10-CM | POA: Diagnosis not present

## 2013-08-27 DIAGNOSIS — Z8719 Personal history of other diseases of the digestive system: Secondary | ICD-10-CM | POA: Diagnosis not present

## 2013-08-27 HISTORY — DX: Pain in leg, unspecified: M79.606

## 2013-08-27 HISTORY — DX: Unspecified abdominal pain: R10.9

## 2013-08-27 HISTORY — DX: Irritable bowel syndrome, unspecified: K58.9

## 2013-08-27 HISTORY — DX: Cervicalgia: M54.2

## 2013-08-27 HISTORY — DX: Other chronic pain: G89.29

## 2013-08-27 HISTORY — DX: Dorsalgia, unspecified: M54.9

## 2013-08-27 LAB — BASIC METABOLIC PANEL
Anion gap: 10 (ref 5–15)
BUN: 12 mg/dL (ref 6–23)
CALCIUM: 9.3 mg/dL (ref 8.4–10.5)
CO2: 27 mEq/L (ref 19–32)
Chloride: 105 mEq/L (ref 96–112)
Creatinine, Ser: 1.02 mg/dL (ref 0.50–1.10)
GFR calc Af Amer: 67 mL/min — ABNORMAL LOW (ref >90)
GFR, EST NON AFRICAN AMERICAN: 58 mL/min — AB (ref >90)
GLUCOSE: 108 mg/dL — AB (ref 70–99)
POTASSIUM: 4.1 meq/L (ref 3.7–5.3)
Sodium: 142 mEq/L (ref 137–147)

## 2013-08-27 LAB — URINALYSIS, ROUTINE W REFLEX MICROSCOPIC
Bilirubin Urine: NEGATIVE
Glucose, UA: NEGATIVE mg/dL
HGB URINE DIPSTICK: NEGATIVE
Ketones, ur: NEGATIVE mg/dL
LEUKOCYTES UA: NEGATIVE
Nitrite: NEGATIVE
PROTEIN: NEGATIVE mg/dL
Specific Gravity, Urine: 1.019 (ref 1.005–1.030)
UROBILINOGEN UA: 1 mg/dL (ref 0.0–1.0)
pH: 6 (ref 5.0–8.0)

## 2013-08-27 LAB — CBC
HCT: 39.4 % (ref 36.0–46.0)
Hemoglobin: 13.1 g/dL (ref 12.0–15.0)
MCH: 27.4 pg (ref 26.0–34.0)
MCHC: 33.2 g/dL (ref 30.0–36.0)
MCV: 82.4 fL (ref 78.0–100.0)
PLATELETS: 304 10*3/uL (ref 150–400)
RBC: 4.78 MIL/uL (ref 3.87–5.11)
RDW: 13.4 % (ref 11.5–15.5)
WBC: 8.7 10*3/uL (ref 4.0–10.5)

## 2013-08-27 LAB — I-STAT TROPONIN, ED: Troponin i, poc: 0 ng/mL (ref 0.00–0.08)

## 2013-08-27 MED ORDER — ONDANSETRON 4 MG PO TBDP
4.0000 mg | ORAL_TABLET | Freq: Once | ORAL | Status: AC
  Administered 2013-08-27: 4 mg via ORAL
  Filled 2013-08-27: qty 1

## 2013-08-27 MED ORDER — OXYCODONE-ACETAMINOPHEN 5-325 MG PO TABS
2.0000 | ORAL_TABLET | Freq: Once | ORAL | Status: AC
  Administered 2013-08-27: 2 via ORAL
  Filled 2013-08-27: qty 2

## 2013-08-27 MED ORDER — METHOCARBAMOL 500 MG PO TABS: 500.0000 mg | ORAL_TABLET | Freq: Two times a day (BID) | ORAL | Status: DC | PRN

## 2013-08-27 MED ORDER — ASPIRIN 325 MG PO TABS: 325.0000 mg | ORAL_TABLET | ORAL | Status: DC

## 2013-08-27 NOTE — ED Notes (Signed)
Placed patient on 2L O2

## 2013-08-27 NOTE — ED Notes (Signed)
Pt states she started having chest pain in the central part of her chest with radiation down the left arm that started 2 days ago.  Pt states the pain has worsened in her chest causing her to come to the ED this am.  Pt states she has had a productive cough x 4 days with clear amounts of mucous.  Pt states the chest pain worsens with cough.  Pt is tender to chest palpation.    Family at bedside.

## 2013-08-27 NOTE — ED Notes (Signed)
Pt here from home with c/o chest pain radiates to left arm , no sob or nausea , pt also c/o low left sided back pain

## 2013-08-27 NOTE — Discharge Instructions (Signed)
°Emergency Department Resource Guide °1) Find a Doctor and Pay Out of Pocket °Although you won't have to find out who is covered by your insurance plan, it is a good idea to ask around and get recommendations. You will then need to call the office and see if the doctor you have chosen will accept you as a new patient and what types of options they offer for patients who are self-pay. Some doctors offer discounts or will set up payment plans for their patients who do not have insurance, but you will need to ask so you aren't surprised when you get to your appointment. ° °2) Contact Your Local Health Department °Not all health departments have doctors that can see patients for sick visits, but many do, so it is worth a call to see if yours does. If you don't know where your local health department is, you can check in your phone book. The CDC also has a tool to help you locate your state's health department, and many state websites also have listings of all of their local health departments. ° °3) Find a Walk-in Clinic °If your illness is not likely to be very severe or complicated, you may want to try a walk in clinic. These are popping up all over the country in pharmacies, drugstores, and shopping centers. They're usually staffed by nurse practitioners or physician assistants that have been trained to treat common illnesses and complaints. They're usually fairly quick and inexpensive. However, if you have serious medical issues or chronic medical problems, these are probably not your best option. ° °No Primary Care Doctor: °- Call Health Connect at  832-8000 - they can help you locate a primary care doctor that  accepts your insurance, provides certain services, etc. °- Physician Referral Service- 1-800-533-3463 ° °Chronic Pain Problems: °Organization         Address  Phone   Notes  °[facility]  (336) 297-2271 Patients need to be referred by their primary care doctor.  ° °Medication  Assistance: °Organization         Address  Phone   Notes  °[place] [facility] [address] °[place], [place] [address] (336) 641-8030 --Must be a resident of [place] °-- Must have NO insurance coverage whatsoever (no Medicaid/ Medicare, etc.) °-- The pt. MUST have a primary care doctor that directs their care regularly and follows them in the community °  °[facility]  (866) 331-1348   °[employer]  (888) 892-1162   ° °Agencies that provide inexpensive medical care: °Organization         Address  Phone   Notes  °[facility]  (336) 832-8035   °[facility]    (336) 832-7272   °[facility] [address] °[place], [place] [address] (336) 832-4777   °[facility] [address], °[place] (336) 271-4999   °[facility]    (336) 373-0678   °[facility]    (336) 272-1050   °[facility] ° [address], [place] Phone:  (336) 832-4444, Fax:  (336) 832-4440 Hours of Operation:  9 am - 6 pm, M-F.  Also accepts Medicaid/Medicare and self-pay.  °[facility] ° [address], [place] Phone: (336) 832-3150, Fax: (336) 832-3151. Hours of Operation:  8:30 am - 5:30 pm, M-F.  Also accepts Medicaid and self-pay.  °[facility] [place] [address], [place] Phone: (336) 878-6027   °[facility] [address], [place], [place] (336)723-1848, Ext. 123 Mondays & Thursdays: 7-9 AM.  First 15 patients are seen on a first come, first serve basis. °  ° °Medicaid-accepting [place] Providers: ° °Organization         Address  Phone   Notes  °[facility] [address], [place] (336) 641-2100 Also accepts self-pay patients.  °[facility] [address], [place] ° (336) 856-9996   °[facility] [address], [place]  (336) 288-8857   °[facility] [address] [place] [address], [place] (336) 299-7000   °Veita Bland [address], [place]  ° (336) 373-1557 Only accepts [place] Access Medicaid patients after they have their name applied to their card.  ° °Self-Pay (no insurance) in [place]: ° °Organization         Address  Phone   Notes  °Sickle Cell Patients, [facility] [address], [place] (336) 832-1970   °[facility] [address], [place] (336) 832-4400   °[facility] ° [address], [place] (336) 992-4800   °[facility]/Dr. Osei-Bonsu ° [address] [place] [address], [place] or [address], [place] (336) 841-8500 Phone number for both [place] and [place] locations is the same.  °[facility] [address], [place] (336) 299-0000   °[facility] [address] [place] [address], [place] or [address] (336) 852-7530 °(336) 878-2260   °[facility] [address], [place] (336) 350-1642, phone; (336) 294-5005, fax Sees patients 1st and 3rd Saturday of every month.  Must not qualify for public or private insurance (i.e. Medicaid, Medicare, [place] Health Choice, Veterans' Benefits) • Household income should be no more than 200% of the poverty level •The clinic cannot treat you if you are pregnant or think you are pregnant • Sexually transmitted diseases are not treated at the clinic.  ° ° °Dental Care: °Organization         Address  Phone  Notes  °[place] [facility] [address], [place] (336) 641-6152 Accepts children up to age 21 who are enrolled in Medicaid or [place] Health Choice; pregnant women with a Medicaid card; and children who have applied for Medicaid or [place] Health Choice, but were declined, whose parents can pay a reduced fee at time of service.  °[place]  [facility] [place]  [address], [place] (336) 641-7733 Accepts children up to age 21 who are enrolled in Medicaid or [place] Health Choice; pregnant women with a Medicaid card; and children who have applied for Medicaid or [place] Health Choice, but were declined, whose parents can pay a reduced fee at time of service.  °[facility] ° [address],  (336) 641-4533 Patients are seen by appointment only. Walk-ins are not accepted. [facility] will see patients 18 years of age and older. °Monday - Tuesday (8am-5pm) °Most Wednesdays (8:30-5pm) °$30 per visit, cash only  °[facility] ° [address], [place] (336) 641-4533 Patients are seen by appointment only. Walk-ins are not accepted. [facility] will see patients 18 years of age and older. °One   Wednesday Evening (Monthly: Volunteer Based).  $30 per visit, cash only  °[facility]  (919) 537-3737 for adults; Children under age 4, call Graduate Pediatric Dentistry at (919) [phone]. Children aged 4-14, please call (919) [phone] to request a pediatric application. ° Dental services are provided in all areas of dental care including fillings, crowns and bridges, complete and partial dentures, implants, gum treatment, root canals, and extractions. Preventive care is also provided. Treatment is provided to both adults and children. °Patients are selected via a lottery and there is often a waiting list. °  °[facility] [address], °[place] ° (336) 763-8833 [email or web address] °  °[facility] [address], [place], [place] (336)723-1848, Ext. 123 Second and Fourth Thursday of each month, opens at 6:30 AM; Clinic ends at 9 AM.  Patients are seen on a first-come first-served basis, and a limited number are seen during each clinic.  ° °[facility] ° [address], [place], [place] (336) 723-7904    Eligibility Requirements °You must have lived in [place], [place], or [place] counties for at least the last three months. °  You cannot be eligible for state or federal sponsored healthcare insurance, including Veterans Administration, Medicaid, or Medicare. °  You generally cannot be eligible for healthcare insurance through your employer.  °  How to apply: °Eligibility screenings are held every Tuesday and Wednesday afternoon from 1:00 pm until 4:00 pm. You do not need an appointment for the interview!  °[facility] [address], [place], [place] 336-631-2330   °[facility]  336-342-8273   °[facility]  336-703-3100   °[facility]  336-570-6415   ° °Behavioral Health Resources in the Community: °Intensive Outpatient Programs °Organization         Address  Phone  Notes  °[facility] [address], [place], [place] 336-878-6098   °[facility] [address], [place], [place] 336-832-9800   °[facility] [address], [place], [place] ° 336-882-2125   °[facility] [address],  °[place], [place] 1-800-853-5163 or 336-641-4981   °Substance Abuse Resources °Organization         Address  Phone  Notes  °[facility]  336-882-2125   °[facility]  336-784-9470   °[facility]  336-285-9073   °[facility]  336-845-3988   °Residential & Outpatient Substance Abuse Program  1-800-659-3381   °Psychological Services °Organization         Address  Phone  Notes  °[facility]  336- 832-9600   °[facility]  336- 378-7881   °[facility] [address], [place] 1-800-853-5163 or 336-641-4981   ° °Mobile Crisis Teams °Organization         Address  Phone  Notes  °[facility], Mobile Crisis Care Unit  1-877-626-1772   °Assertive °[facility] ° [address]  [place], [place] 336-834-9664   °Sharon DeEsch [address] °[place] 336-554-5454   ° °Self-Help/Support Groups °Organization         Address  Phone             Notes  °[facility] [place] - variety of support groups  336- 373-1402 Call for more information  °Narcotics Anonymous (NA), [facility] [address], °[place]  2 meetings at this location  ° °  Residential Treatment Programs Organization         Address  Phone  Notes  [facility] [address]    [place]  1-671-656-8420   [facility]  [address], [place] [address], [place], [place]   [place], [place] 249-669-5689 Admissions: 8am-3pm M-F  [facility] [place] [address],    [place], [place] 914-782-9562   [facility] [address] [place], [place], [place]   [facility] [address],  [place], [place]   [facility] - Intensive Outpatient [address], [place], [place]   [facility] ([place] [address]  [place], [place] 1-336-424-5712 or 4098624714   [facility] [address], [place], [place] Accepts Medicaid  [facility] [address],  [place] 1-860 468 7271 Substance Abuse/Addiction Treatment   [facility]         Address  Phone  Notes  [facility]  989-685-4195   Domenic Schwab, PhD [address] [place], [place]   701-671-8826 or 681-521-7773   [facility] [address] [place], [place] (205)239-4555   [facility] [address] [place], [place] 858-111-8589 Insurance/Medicaid/sponsorship through [facility] [address]                                    [place], [place] [phone] [place] [address], [place] (747)532-7011    Dr. Adele Schilder  [phone]   [facility] Dept. 1) [address] [place] 2) [place] 3)  [address], [place] 587-439-6301 (808)[phone]  (540)278-4197   [place] 4354630555 or (781)[phone] (After Hours)      Take the prescription as directed. Continue to take your usual prescriptions as previously directed. Apply moist heat or ice to the area(s) of discomfort, for 15 minutes at a time, several times per day for the next few days.  Do not fall asleep on a heating or ice pack.  Call your regular medical doctor and your Pain Management doctor today to schedule a follow up appointment in the next 2 days.  Return to the Emergency Department immediately if worsening.

## 2013-08-27 NOTE — ED Provider Notes (Signed)
CSN: [id number]     Arrival date & time 08/27/13  0711 History   First MD Initiated Contact with Patient 08/27/13 [phone]     Chief Complaint  Patient presents with  . Chest Pain     HPI Pt was seen at 0740.  Per pt, c/o gradual onset and persistence of constant multiple complaints for the past 2 days. Complaints include: left upper chest wall pain, left shoulder pain, left sided lower back pain, and cough. Describes her lower back pain as per her usual chronic pain pattern. Describes the chest and left shoulder pain as "aching" and "burning," worse with palpation of the area, movement of her left arm and body position changes. Denies rash, no fevers, no palpitations, no SOB, no abd pain, no N/V/D. Denies incont/retention of bowel or bladder, no saddle anesthesia, no focal motor weakness, no tingling/numbness in extremities, no injury.     Past Medical History  Diagnosis Date  . Achilles tendonitis 10/2010  . Ankle deformity 10/2010  . Peripheral neuropathy 10/2010    Secondary to Lumbar Radiculopathy  . Lumbar radiculopathy 10/2010  . Chronic leg pain     left  . Chronic back pain   . Chronic neck pain   . Chronic abdominal pain   . IBS (irritable bowel syndrome)    Past Surgical History  Procedure Laterality Date  . Austin bunionectomy Left 2003  . Cotton osteotomy w/ graft Left 04/10/12  . Tal lengthening Left 04/10/2012  . Nail matrixectomy Left 04/10/12  . Foot surgery      History  Substance Use Topics  . Smoking status: Never Smoker   . Smokeless tobacco: Not on file  . Alcohol Use: No    Review of Systems ROS: Statement: All systems negative except as marked or noted in the HPI; Constitutional: Negative for fever and chills. ; ; Eyes: Negative for eye pain, redness and discharge. ; ; ENMT: Negative for ear pain, hoarseness, nasal congestion, sinus pressure and sore throat. ; ; Cardiovascular: Negative for palpitations, diaphoresis, dyspnea and peripheral edema. ; ;  Respiratory: +cough. Negative for wheezing and stridor. ; ; Gastrointestinal: Negative for nausea, vomiting, diarrhea, abdominal pain, blood in stool, hematemesis, jaundice and rectal bleeding. . ; ; Genitourinary: Negative for dysuria, flank pain and hematuria. ; ; Musculoskeletal: +chest wall pain, left shoulder pain, low back pain. Negative for neck pain. Negative for swelling and trauma.; ; Skin: Negative for pruritus, rash, abrasions, blisters, bruising and skin lesion.; ; Neuro: Negative for headache, lightheadedness and neck stiffness. Negative for weakness, altered level of consciousness , altered mental status, extremity weakness, paresthesias, involuntary movement, seizure and syncope.      Allergies  Demerol and Penicillins  Home Medications   Prior to Admission medications   Medication Sig Start Date End Date Taking? Authorizing Provider  HYDROcodone-acetaminophen (NORCO/VICODIN) 5-325 MG per tablet Take 2 tablets by mouth every 6 (six) hours as needed for pain. 06/08/12   Teressa Lower, MD  ibuprofen (ADVIL,MOTRIN) 800 MG tablet Take 1 tablet (800 mg total) by mouth 3 (three) times daily. 05/10/13   Kaitlyn Szekalski, PA-C  Oxycodone HCl 10 MG TABS Take 1 tablet (10 mg total) by mouth 4 (four) times daily as needed (pain). 07/04/12   Myeong Sheard, DPM  zolpidem (AMBIEN) 5 MG tablet Take 1 tablet (5 mg total) by mouth at bedtime as needed for sleep. 05/21/12   Myeong Sheard, DPM   BP 129/81  Pulse 75  Temp(Src) 97.7 F (36.5 C) (Oral)  Resp 20  SpO2 98% Physical Exam 0745; Physical examination:  Nursing notes reviewed; Vital signs and O2 SAT reviewed;  Constitutional: Well developed, Well nourished, Well hydrated, In no acute distress; Head:  Normocephalic, atraumatic; Eyes: EOMI, PERRL, No scleral icterus; ENMT: Mouth and pharynx normal, Mucous membranes moist; Neck: Supple, Full range of motion, No lymphadenopathy; Cardiovascular: Regular rate and rhythm, No murmur, rub, or gallop;  Respiratory: Breath sounds clear & equal bilaterally, No rales, rhonchi, wheezes.  Speaking full sentences with ease, Normal respiratory effort/excursion; Chest: +left upper anterior chest wall tenderness to palp. No soft tissue crepitus, no rash, no deformity. Movement normal; Abdomen: Soft, Nontender, Nondistended, Normal bowel sounds; Genitourinary: No CVA tenderness; Spine:  No midline CS, TS, LS tenderness. +TTP left lumbar paraspinal muscles. No rash.;; Extremities: Pulses normal, Left shoulder w/FROM.  +generalized tenderness to lateral deltoid area. Left clavicle NT, scapula NT, proximal humerus NT, biceps tendon NT over bicipital groove.  Motor strength at shoulder normal.  Sensation intact over deltoid region, distal NMS intact with left hand having intact and equal sensation and strength in the distribution of the median, radial, and ulnar nerve function compared to opposite side.  Strong radial pulse.  +FROM left elbow with intact motor strength biceps and triceps muscles to resistance. No rash. No deformity. No edema, No calf edema or asymmetry.; Neuro: AA&Ox3, Major CN grossly intact.  Speech clear. No gross focal motor or sensory deficits in extremities. Climbs on and off stretcher easily by herself. Gait steady.; Skin: Color normal, Warm, Dry.   ED Course  Procedures     EKG Interpretation   Date/Time:  Thursday August 27 2013 07:15:01 EDT Ventricular Rate:  77 PR Interval:  154 QRS Duration: 74 QT Interval:  390 QTC Calculation: 441 R Axis:   -18 Text Interpretation:  Normal sinus rhythm Cannot rule out Anterior infarct  , age undetermined Baseline wander Artifact Abnormal ECG When compared  with ECG of 05/10/2012 Nonspecific T wave abnormality is no longer Present  Confirmed by [facility]  MD, Nunzio Cory [phone]) on 08/27/2013 7:52:55 AM      MDM  MDM Reviewed: previous chart, nursing note and vitals Reviewed previous: labs, ECG, CT scan, MRI and x-ray Interpretation: labs, ECG  and x-ray   Results for orders placed during the hospital encounter of 61/95/09  BASIC METABOLIC PANEL      Result Value Ref Range   Sodium 142  137 - 147 mEq/L   Potassium 4.1  3.7 - 5.3 mEq/L   Chloride 105  96 - 112 mEq/L   CO2 27  19 - 32 mEq/L   Glucose, Bld 108 (*) 70 - 99 mg/dL   BUN 12  6 - 23 mg/dL   Creatinine, Ser 1.02  0.50 - 1.10 mg/dL   Calcium 9.3  8.4 - 10.5 mg/dL   GFR calc non Af Amer 58 (*) >90 mL/min   GFR calc Af Amer 67 (*) >90 mL/min   Anion gap 10  5 - 15  CBC      Result Value Ref Range   WBC 8.7  4.0 - 10.5 K/uL   RBC 4.78  3.87 - 5.11 MIL/uL   Hemoglobin 13.1  12.0 - 15.0 g/dL   HCT 39.4  36.0 - 46.0 %   MCV 82.4  78.0 - 100.0 fL   MCH 27.4  26.0 - 34.0 pg   MCHC 33.2  30.0 - 36.0 g/dL   RDW 13.4  11.5 - 15.5 %   Platelets  304  150 - 400 K/uL  URINALYSIS, ROUTINE W REFLEX MICROSCOPIC      Result Value Ref Range   Color, Urine YELLOW  YELLOW   APPearance CLEAR  CLEAR   Specific Gravity, Urine 1.019  1.005 - 1.030   pH 6.0  5.0 - 8.0   Glucose, UA NEGATIVE  NEGATIVE mg/dL   Hgb urine dipstick NEGATIVE  NEGATIVE   Bilirubin Urine NEGATIVE  NEGATIVE   Ketones, ur NEGATIVE  NEGATIVE mg/dL   Protein, ur NEGATIVE  NEGATIVE mg/dL   Urobilinogen, UA 1.0  0.0 - 1.0 mg/dL   Nitrite NEGATIVE  NEGATIVE   Leukocytes, UA NEGATIVE  NEGATIVE  I-STAT TROPOININ, ED      Result Value Ref Range   Troponin i, poc 0.00  0.00 - 0.08 ng/mL   Comment 3            Dg Chest 1 View 08/27/2013   CLINICAL DATA:  Chest pain.  EXAM: CHEST - 1 VIEW  COMPARISON:  Radiograph of same day.  FINDINGS: Lateral view only of the chest demonstrates no significant pleural effusion. Visualized skeleton appears normal. No definite abnormality seen.  IMPRESSION: No definite abnormality seen on lateral view only of the chest.   Electronically Signed   By: Sabino Dick M.D.   On: 08/27/2013 08:41   Dg Chest Port 1 View 08/27/2013   CLINICAL DATA:  Chest pain extending into the left arm   EXAM: PORTABLE CHEST - 1 VIEW  COMPARISON:  05/10/2012  FINDINGS: The lungs are clear and negative for focal airspace consolidation, pulmonary edema or suspicious pulmonary nodule. No pleural effusion or pneumothorax. Cardiac and mediastinal contours are within normal limits. No acute fracture or lytic or blastic osseous lesions. The visualized upper abdominal bowel gas pattern is unremarkable.  IMPRESSION: No active cardiopulmonary disease.   Electronically Signed   By: Jacqulynn Cadet M.D.   On: 08/27/2013 09:09   Dg Shoulder Left 08/27/2013   CLINICAL DATA:  Left shoulder pain  EXAM: LEFT SHOULDER - 2+ VIEW  COMPARISON:  None.  FINDINGS: Degenerative changes of the [facility] joint with mild inferior osteophyte formation. No acute fracture. No dislocation.  IMPRESSION: No acute bony pathology.  Chronic changes.   Electronically Signed   By: Maryclare Bean M.D.   On: 08/27/2013 08:40    1000:  Pt feels better after meds and wants to go home now. Doubt PE as cause for symptoms with low risk [place].  Doubt ACS as cause for symptoms with normal troponin and unchanged EKG from previous after 2 days of constant symptoms. Will tx symptomatically at this time. [place] Controlled Substance Database accessed: pt received #90 tabs of oxycodone 15mg  on 08/07/13. Pt told Pharm Tech she was rx oxycodone 10mg  tabs and "didn't know" when she took them last. Concerned regarding this information and will not rx any further narcotics at this time. Dx and testing d/w pt and family.  Questions answered.  Verb understanding, agreeable to d/c home with outpt f/u.      Francine Graven, DO 08/31/13 1528

## 2013-08-27 NOTE — ED Notes (Signed)
Dr. Elise Benne made aware the pt was feeling sick to her stomach at discharge.  Pt was spitting in to an emesis bag.  Pt alert and oriented with drowsiness.  See MAR for order.

## 2013-08-27 NOTE — ED Notes (Signed)
Pt alert and oriented at discharge.  Pt having mild nausea at discharge, given ODT Zofran prior to leaving the ED.  Pt provided a wheelchair by this RN and taken to a waiting where family was driving her home.  Pt and family verbalized understanding of discharge instructions with readback on follow up appointments and medication.

## 2013-08-28 NOTE — Discharge Planning (Signed)
[facility] Community Liaison was not able to see the patient, [facility] orange card information will be sent to the address listed °

## 2013-09-08 ENCOUNTER — Ambulatory Visit (INDEPENDENT_AMBULATORY_CARE_PROVIDER_SITE_OTHER): Payer: BC Managed Care – PPO | Admitting: Family Medicine

## 2013-09-08 VITALS — BP 110/70 | HR 69 | Temp 98.0°F | Resp 16 | Ht 65.0 in | Wt 193.0 lb

## 2013-09-08 DIAGNOSIS — R8281 Pyuria: Secondary | ICD-10-CM

## 2013-09-08 DIAGNOSIS — M545 Low back pain, unspecified: Secondary | ICD-10-CM

## 2013-09-08 DIAGNOSIS — M778 Other enthesopathies, not elsewhere classified: Secondary | ICD-10-CM

## 2013-09-08 DIAGNOSIS — R82998 Other abnormal findings in urine: Secondary | ICD-10-CM

## 2013-09-08 DIAGNOSIS — M719 Bursopathy, unspecified: Secondary | ICD-10-CM

## 2013-09-08 DIAGNOSIS — M67919 Unspecified disorder of synovium and tendon, unspecified shoulder: Secondary | ICD-10-CM

## 2013-09-08 DIAGNOSIS — M7582 Other shoulder lesions, left shoulder: Secondary | ICD-10-CM

## 2013-09-08 MED ORDER — PREDNISONE 20 MG PO TABS: 40.0000 mg | ORAL_TABLET | Freq: Every day | ORAL | Status: DC

## 2013-09-08 MED ORDER — CIPROFLOXACIN HCL 500 MG PO TABS: 500.0000 mg | ORAL_TABLET | Freq: Two times a day (BID) | ORAL | Status: DC

## 2013-09-08 NOTE — Progress Notes (Signed)
         Color, Urine YELLOW  YELLOW    YELLOW    ORANGE BIOCHEMICALS MAY... (A)        APPearance CLEAR  CLEAR    CLEAR    CLEAR        Specific Gravity, Urine 1.005 - 1.030  1.010    1.017    1.028        pH 5.0 - 8.0  7.0    7.5    5.0        Glucose, UA NEGATIVE mg/dL NEGATIVE    NEGATIVE    NEGATIVE        Hgb urine dipstick NEGATIVE  NEGATIVE    NEGATIVE    NEGATIVE        Bilirubin Urine NEGATIVE  NEGATIVE    NEGATIVE    SMALL (A)        Ketones, ur NEGATIVE mg/dL NEGATIVE    NEGATIVE    15 (A)        Protein, ur NEGATIVE mg/dL NEGATIVE    NEGATIVE    NEGATIVE        Urobilinogen, UA 0.0 - 1.0 mg/dL 0.2    1.0    4.0 (H)        Nitrite NEGATIVE  NEGATIVE    NEGATIVE    POSITIVE (A)        Leukocytes, UA NEGATIVE  TRACE (A)    NEGATIVE MICROSCOPIC NOT D...    SMALL (A)       Resulting Agency   [id number]

## 2013-09-08 NOTE — Patient Instructions (Signed)
Results for orders placed during the hospital encounter of 82/99/37  BASIC METABOLIC PANEL      Result Value Ref Range   Sodium 142  137 - 147 mEq/L   Potassium 4.1  3.7 - 5.3 mEq/L   Chloride 105  96 - 112 mEq/L   CO2 27  19 - 32 mEq/L   Glucose, Bld 108 (*) 70 - 99 mg/dL   BUN 12  6 - 23 mg/dL   Creatinine, Ser 1.02  0.50 - 1.10 mg/dL   Calcium 9.3  8.4 - 10.5 mg/dL   GFR calc non Af Amer 58 (*) >90 mL/min   GFR calc Af Amer 67 (*) >90 mL/min   Anion gap 10  5 - 15  CBC      Result Value Ref Range   WBC 8.7  4.0 - 10.5 K/uL   RBC 4.78  3.87 - 5.11 MIL/uL   Hemoglobin 13.1  12.0 - 15.0 g/dL   HCT 39.4  36.0 - 46.0 %   MCV 82.4  78.0 - 100.0 fL   MCH 27.4  26.0 - 34.0 pg   MCHC 33.2  30.0 - 36.0 g/dL   RDW 13.4  11.5 - 15.5 %   Platelets 304  150 - 400 K/uL  URINALYSIS, ROUTINE W REFLEX MICROSCOPIC      Result Value Ref Range   Color, Urine YELLOW  YELLOW   APPearance CLEAR  CLEAR   Specific Gravity, Urine 1.019  1.005 - 1.030   pH 6.0  5.0 - 8.0   Glucose, UA NEGATIVE  NEGATIVE mg/dL   Hgb urine dipstick NEGATIVE  NEGATIVE   Bilirubin Urine NEGATIVE  NEGATIVE   Ketones, ur NEGATIVE  NEGATIVE mg/dL   Protein, ur NEGATIVE  NEGATIVE mg/dL   Urobilinogen, UA 1.0  0.0 - 1.0 mg/dL   Nitrite NEGATIVE  NEGATIVE   Leukocytes, UA NEGATIVE  NEGATIVE  I-STAT TROPOININ, ED      Result Value Ref Range   Troponin i, poc 0.00  0.00 - 0.08 ng/mL   Comment 3

## 2013-09-08 NOTE — Progress Notes (Signed)
63 year old woman who no longer works. She is seen here with her husband. She's had 10-14 days of left shoulder pain, worse when she raises her left arm. She's also tender in the anterior joint line.  Patient had no injury. She went to the emergency room a week ago where an x-ray was taken along with the chest x-ray. Both the chest and left shoulder x-rays were fine.  Patient also has right lower back pain radiating into the right buttock. Her urinalysis from a week ago was noncontributory. Nevertheless she's having frequency and dysuria. She finds that she gets up several times a night to use the bathroom.  Patient had no fever  Objective: Patient is alert and cooperative and in no acute distress Vision is tender in the anterior joint line of left arm. She's reluctant to abduct this because of pain. She is able to swing a forward and backward recently comfortably. Chest: Clear to auscultation Palpation of back reveals some tenderness in the paraspinal lumbar region. Straight leg raising is negative. Her gait is stable.  Assessment: Possible UTI, left shoulder tendinitis   Right low back pain, with sciatica presence unspecified - Plan: Urine culture, ciprofloxacin (CIPRO) 500 MG tablet  Left shoulder tendonitis - Plan: predniSONE (DELTASONE) 20 MG tablet  Pyuria - Plan: ciprofloxacin (CIPRO) 500 MG tablet  Signed, [name], MD

## 2013-09-09 LAB — URINE CULTURE
Colony Count: NO GROWTH
Organism ID, Bacteria: NO GROWTH

## 2013-09-11 ENCOUNTER — Emergency Department (HOSPITAL_COMMUNITY)
Admission: EM | Admit: 2013-09-11 | Discharge: 2013-09-11 | Disposition: A | Discharge status: Home / Self Care | Payer: BC Managed Care – PPO | Attending: Emergency Medicine | Admitting: Emergency Medicine

## 2013-09-11 ENCOUNTER — Emergency Department (HOSPITAL_COMMUNITY): Payer: BC Managed Care – PPO

## 2013-09-11 DIAGNOSIS — Z8669 Personal history of other diseases of the nervous system and sense organs: Secondary | ICD-10-CM | POA: Insufficient documentation

## 2013-09-11 DIAGNOSIS — Z792 Long term (current) use of antibiotics: Secondary | ICD-10-CM | POA: Diagnosis not present

## 2013-09-11 DIAGNOSIS — M25519 Pain in unspecified shoulder: Secondary | ICD-10-CM | POA: Insufficient documentation

## 2013-09-11 DIAGNOSIS — Z880 Allergy status to penicillin: Secondary | ICD-10-CM | POA: Insufficient documentation

## 2013-09-11 DIAGNOSIS — M25512 Pain in left shoulder: Secondary | ICD-10-CM

## 2013-09-11 DIAGNOSIS — Z8719 Personal history of other diseases of the digestive system: Secondary | ICD-10-CM | POA: Insufficient documentation

## 2013-09-11 DIAGNOSIS — G8929 Other chronic pain: Secondary | ICD-10-CM | POA: Diagnosis not present

## 2013-09-11 DIAGNOSIS — IMO0001 Reserved for inherently not codable concepts without codable children: Secondary | ICD-10-CM | POA: Insufficient documentation

## 2013-09-11 DIAGNOSIS — M791 Myalgia, unspecified site: Secondary | ICD-10-CM

## 2013-09-11 DIAGNOSIS — Z79899 Other long term (current) drug therapy: Secondary | ICD-10-CM | POA: Diagnosis not present

## 2013-09-11 LAB — BASIC METABOLIC PANEL
Anion gap: 12 (ref 5–15)
BUN: 12 mg/dL (ref 6–23)
CHLORIDE: 102 meq/L (ref 96–112)
CO2: 27 meq/L (ref 19–32)
Calcium: 9.6 mg/dL (ref 8.4–10.5)
Creatinine, Ser: 0.89 mg/dL (ref 0.50–1.10)
GFR calc Af Amer: 79 mL/min — ABNORMAL LOW (ref >90)
GFR calc non Af Amer: 69 mL/min — ABNORMAL LOW (ref >90)
GLUCOSE: 96 mg/dL (ref 70–99)
Potassium: 3.8 mEq/L (ref 3.7–5.3)
Sodium: 141 mEq/L (ref 137–147)

## 2013-09-11 LAB — CBC
HEMATOCRIT: 41.2 % (ref 36.0–46.0)
Hemoglobin: 13.7 g/dL (ref 12.0–15.0)
MCH: 27.3 pg (ref 26.0–34.0)
MCHC: 33.3 g/dL (ref 30.0–36.0)
MCV: 82.2 fL (ref 78.0–100.0)
Platelets: 356 10*3/uL (ref 150–400)
RBC: 5.01 MIL/uL (ref 3.87–5.11)
RDW: 13.6 % (ref 11.5–15.5)
WBC: 10.2 10*3/uL (ref 4.0–10.5)

## 2013-09-11 LAB — TROPONIN I: Troponin I: <0.3 ng/mL (ref <0.30)

## 2013-09-11 NOTE — Discharge Instructions (Signed)
Please call your doctor for a followup appointment within 24-48 hours. When you talk to your doctor please let them know that you were seen in the emergency department and have them acquire all of your records so that they can discuss the findings with you and formulate a treatment plan to fully care for your new and ongoing problems. Please call and set-up an appointment with your primary care provider Please call and set up an appointment with orthopedics Please continue to use medications as prescribed Please avoid any physical or strenuous activity Please massage with icy hot ointment and perform slow circular motions Please perform reported therapy for this will produce resistance to eating drinking of the muscles Please continue to monitor symptoms closely and if symptoms are to worsen or change (fever greater than 101, chills, sweating, nausea, vomiting, chest pain, shortness of breathe, difficulty breathing, weakness, numbness, tingling, worsening or changes to pain pattern, fall, injury, decreased grip strength, decreased range of motion to the fingers, loss sensation) please report back to the Emergency Department immediately.    Arthritis, Nonspecific Arthritis is inflammation of a joint. This usually means pain, redness, warmth or swelling are present. One or more joints may be involved. There are a number of types of arthritis. Your caregiver may not be able to tell what type of arthritis you have right away. CAUSES  The most common cause of arthritis is the wear and tear on the joint (osteoarthritis). This causes damage to the cartilage, which can break down over time. The knees, hips, back and neck are most often affected by this type of arthritis. Other types of arthritis and common causes of joint pain include:  Sprains and other injuries near the joint. Sometimes minor sprains and injuries cause pain and swelling that develop hours later.  Rheumatoid arthritis. This affects hands,  feet and knees. It usually affects both sides of your body at the same time. It is often associated with chronic ailments, fever, weight loss and general weakness.  Crystal arthritis. Gout and pseudo gout can cause occasional acute severe pain, redness and swelling in the foot, ankle, or knee.  Infectious arthritis. Bacteria can get into a joint through a break in overlying skin. This can cause infection of the joint. Bacteria and viruses can also spread through the blood and affect your joints.  Drug, infectious and allergy reactions. Sometimes joints can become mildly painful and slightly swollen with these types of illnesses. SYMPTOMS   Pain is the main symptom.  Your joint or joints can also be red, swollen and warm or hot to the touch.  You may have a fever with certain types of arthritis, or even feel overall ill.  The joint with arthritis will hurt with movement. Stiffness is present with some types of arthritis. DIAGNOSIS  Your caregiver will suspect arthritis based on your description of your symptoms and on your exam. Testing may be needed to find the type of arthritis:  Blood and sometimes urine tests.  X-ray tests and sometimes CT or MRI scans.  Removal of fluid from the joint (arthrocentesis) is done to check for bacteria, crystals or other causes. Your caregiver (or a specialist) will numb the area over the joint with a local anesthetic, and use a needle to remove joint fluid for examination. This procedure is only minimally uncomfortable.  Even with these tests, your caregiver may not be able to tell what kind of arthritis you have. Consultation with a specialist (rheumatologist) may be helpful. TREATMENT  Your caregiver will discuss with you treatment specific to your type of arthritis. If the specific type cannot be determined, then the following general recommendations may apply. Treatment of severe joint pain includes:  Rest.  Elevation.  Anti-inflammatory  medication (for example, ibuprofen) may be prescribed. Avoiding activities that cause increased pain.  Only take over-the-counter or prescription medicines for pain and discomfort as recommended by your caregiver.  Cold packs over an inflamed joint may be used for 10 to 15 minutes every hour. Hot packs sometimes feel better, but do not use overnight. Do not use hot packs if you are diabetic without your caregiver's permission.  A cortisone shot into arthritic joints may help reduce pain and swelling.  Any acute arthritis that gets worse over the next 1 to 2 days needs to be looked at to be sure there is no joint infection. Long-term arthritis treatment involves modifying activities and lifestyle to reduce joint stress jarring. This can include weight loss. Also, exercise is needed to nourish the joint cartilage and remove waste. This helps keep the muscles around the joint strong. HOME CARE INSTRUCTIONS   Do not take aspirin to relieve pain if gout is suspected. This elevates uric acid levels.  Only take over-the-counter or prescription medicines for pain, discomfort or fever as directed by your caregiver.  Rest the joint as much as possible.  If your joint is swollen, keep it elevated.  Use crutches if the painful joint is in your leg.  Drinking plenty of fluids may help for certain types of arthritis.  Follow your caregiver's dietary instructions.  Try low-impact exercise such as:  Swimming.  Water aerobics.  Biking.  Walking.  Morning stiffness is often relieved by a warm shower.  Put your joints through regular range-of-motion. SEEK MEDICAL CARE IF:   You do not feel better in 24 hours or are getting worse.  You have side effects to medications, or are not getting better with treatment. SEEK IMMEDIATE MEDICAL CARE IF:   You have a fever.  You develop severe joint pain, swelling or redness.  Many joints are involved and become painful and swollen.  There is  severe back pain and/or leg weakness.  You have loss of bowel or bladder control. Document Released: 02/02/2004 Document Revised: 03/19/2011 Document Reviewed: 02/18/2008 [facility] Patient Information 2015 [place], [place]. This information is not intended to replace advice given to you by your health care provider. Make sure you discuss any questions you have with your health care provider.  Muscle Pain Muscle pain (myalgia) may be caused by many things, including:  Overuse or muscle strain, especially if you are not in shape. This is the most common cause of muscle pain.  Injury.  Bruises.  Viruses, such as the flu.  Infectious diseases.  Fibromyalgia, which is a chronic condition that causes muscle tenderness, fatigue, and headache.  Autoimmune diseases, including lupus.  Certain drugs, including ACE inhibitors and statins. Muscle pain may be mild or severe. In most cases, the pain lasts only a short time and goes away without treatment. To diagnose the cause of your muscle pain, your health care provider will take your medical history. This means he or she will ask you when your muscle pain began and what has been happening. If you have not had muscle pain for very long, your health care provider may want to wait before doing much testing. If your muscle pain has lasted a long time, your health care provider may want to run tests  right away. If your health care provider thinks your muscle pain may be caused by illness, you may need to have additional tests to rule out certain conditions.  Treatment for muscle pain depends on the cause. Home care is often enough to relieve muscle pain. Your health care provider may also prescribe anti-inflammatory medicine. HOME CARE INSTRUCTIONS Watch your condition for any changes. The following actions may help to lessen any discomfort you are feeling:  Only take over-the-counter or prescription medicines as directed by your health care  provider.  Apply ice to the sore muscle:  Put ice in a plastic bag.  Place a towel between your skin and the bag.  Leave the ice on for 15-20 minutes, 3-4 times a day.  You may alternate applying hot and cold packs to the muscle as directed by your health care provider.  If overuse is causing your muscle pain, slow down your activities until the pain goes away.  Remember that it is normal to feel some muscle pain after starting a workout program. Muscles that have not been used often will be sore at first.  Do regular, gentle exercises if you are not usually active.  Warm up before exercising to lower your risk of muscle pain.  Do not continue working out if the pain is very bad. Bad pain could mean you have injured a muscle. SEEK MEDICAL CARE IF:  Your muscle pain gets worse, and medicines do not help.  You have muscle pain that lasts longer than 3 days.  You have a rash or fever along with muscle pain.  You have muscle pain after a tick bite.  You have muscle pain while working out, even though you are in good physical condition.  You have redness, soreness, or swelling along with muscle pain.  You have muscle pain after starting a new medicine or changing the dose of a medicine. SEEK IMMEDIATE MEDICAL CARE IF:  You have trouble breathing.  You have trouble swallowing.  You have muscle pain along with a stiff neck, fever, and vomiting.  You have severe muscle weakness or cannot move part of your body. MAKE SURE YOU:   Understand these instructions.  Will watch your condition.  Will get help right away if you are not doing well or get worse. Document Released: 11/16/2005 Document Revised: 12/30/2012 Document Reviewed: 10/21/2012 [facility] Patient Information 2015 [place], [place]. This information is not intended to replace advice given to you by your health care provider. Make sure you discuss any questions you have with your health care provider.

## 2013-09-11 NOTE — ED Notes (Signed)
Pt denies chest pain at this time.

## 2013-09-11 NOTE — ED Notes (Signed)
Pt refused blood work, stating "I just got that done last week". [place], PA is aware. Pt states that she just wants and x-ray.

## 2013-09-11 NOTE — ED Notes (Signed)
The pt has had lt arm and shoulder pain for 2-3 weeks.  She was seen here  Monday for the same.  She was diagnosed with with bursitis .  No injury.  The pain is worse with movement especially when she attempts to dress

## 2013-09-11 NOTE — ED Notes (Signed)
Per [place], PA pt is now reporting chest pain.

## 2013-09-11 NOTE — ED Provider Notes (Signed)
CSN: [id number]     Arrival date & time 09/11/13  1628 History  This chart was scribed for Kelly Mead, PA-C working with [name], MD by Kelly Rosales, ED Scribe. This patient was seen in room TR06C/TR06C and the patient's care was started at 7:57 PM.      Chief Complaint  Patient presents with  . Arm Pain   Patient is a 63 y.o. female presenting with arm pain. The history is provided by the patient. No language interpreter was used.  Arm Pain Pertinent negatives include no shortness of breath.   HPI Comments: Kelly Rosales is a 63 y.o. female with PMHx of achilles tendonitis, ankle deformity, peripheral neuropathy, lumbar radiculopathy, chronic leg pain, chronic back pain, chronic neck pain and IBS who presents to the Emergency Department complaining of aching left shoulder pain onset 3 weeks prior. She states the pain radiates in to her neck. She states her pain worsens with movement of the arm. She states that she is not able to move the arm to move her to put her clothes on. She states she was having some chest pain a few days ago that has now resolved. She was recently seen in the ED for similar symptoms of shoulder pain on 08/27/13 and was diagnosed with a bursitis. She states that her symptoms have not improved. She sates she has been taking prednisone for her shoulder pain with no relief. She states she also was evaluated at urgent care and was diagnosed with a shoulder tendonitis. She denies any injury or trauma to the arm. Denies difficulty swallowing, fever, red streaks,  weakness, numbness, loss of sensation, difficulty breathing, SOB, changes to symptoms, dizziness, decreased grip strength, travel.  PCP Dr. Merrilee Jansky   Past Medical History  Diagnosis Date  . Achilles tendonitis 10/2010  . Ankle deformity 10/2010  . Peripheral neuropathy 10/2010    Secondary to Lumbar Radiculopathy  . Lumbar radiculopathy 10/2010  . Chronic leg pain     left  . Chronic back pain    . Chronic neck pain   . Chronic abdominal pain   . IBS (irritable bowel syndrome)    Past Surgical History  Procedure Laterality Date  . [place] bunionectomy Left 2003  . Cotton osteotomy w/ graft Left 04/10/12  . Tal lengthening Left 04/10/2012  . Nail matrixectomy Left 04/10/12  . Foot surgery     Family History  Problem Relation Age of Onset  . Cancer Mother   . Diabetes Mother   . Heart disease Mother   . Hyperlipidemia Mother   . Hypertension Mother    History  Substance Use Topics  . Smoking status: Never Smoker   . Smokeless tobacco: Not on file  . Alcohol Use: No   OB History   Grav Para Term Preterm Abortions TAB SAB Ect Mult Living                 Review of Systems  HENT: Negative for trouble swallowing.   Respiratory: Negative for shortness of breath.   Musculoskeletal: Positive for arthralgias and neck pain.  Neurological: Negative for weakness and numbness.   Allergies  Demerol; Percocet; and Penicillins  Home Medications   Prior to Admission medications   Medication Sig Start Date End Date Taking? Authorizing Provider  albuterol (PROVENTIL HFA;VENTOLIN HFA) 108 (90 BASE) MCG/ACT inhaler Inhale 2 puffs into the lungs every 6 (six) hours as needed for wheezing or shortness of breath.   Yes Historical Provider, MD  ciprofloxacin (CIPRO) 500 MG tablet Take 500 mg by mouth 2 (two) times daily. Started a week ago from today 09-11-13   Yes Historical Provider, MD  hydrochlorothiazide (HYDRODIURIL) 25 MG tablet Take 25 mg by mouth daily as needed (fluid).   Yes Historical Provider, MD  methocarbamol (ROBAXIN) 500 MG tablet Take 500 mg by mouth 2 (two) times daily as needed for muscle spasms. 08/27/13  Yes Francine Graven, DO   Triage Vitals: BP 149/85  Pulse 61  Temp(Src) 97.5 F (36.4 C) (Oral)  Ht 5\' 5"  (1.651 m)  Wt 187 lb (84.823 kg)  BMI 31.12 kg/m2  SpO2 95%  Physical Exam  Nursing note and vitals reviewed. Constitutional: She is oriented to person,  place, and time. She appears well-developed and well-nourished. No distress.  HENT:  Head: Normocephalic and atraumatic.  Mouth/Throat: Oropharynx is clear and moist. No oropharyngeal exudate.  Eyes: Conjunctivae and EOM are normal. Pupils are equal, round, and reactive to light. Right eye exhibits no discharge. Left eye exhibits no discharge.  Neck: Normal range of motion. Neck supple. No tracheal deviation present.    Negative neck stiffness Negative nuchal rigidity  Negative cervical lymphadenopathy  Negative meningeal signs  Muscular discomfort upon palpation to the left side of the neck  Discomfort upon palpation to the left trapezius muscle  Cardiovascular: Normal rate, regular rhythm and normal heart sounds.  Exam reveals no friction rub.   No murmur heard. Cap refill < 3 seconds  Pulmonary/Chest: Effort normal and breath sounds normal. No respiratory distress. She has no wheezes. She has no rales. She exhibits no tenderness.  Patient stable to speak in full sentences without difficulty Negative use of accessory muscles Negative stridor Negative pain upon palpation to the chest wall - negative crepitus or depressions upon palpation to the chest wall  Musculoskeletal: She exhibits tenderness. She exhibits no edema.       Left shoulder: She exhibits decreased range of motion (secondary to pain) and tenderness. She exhibits no swelling, no effusion, no crepitus, no deformity, no laceration, no pain and no spasm.       Arms: Negative swelling, erythema, inflammation, lesions, sores, deformities, malalignment, sunken in appearance identified to the left shoulder. Discomfort upon palpation to anterior posterior aspect of the glenohumeral joint. Negative crepitus with motion. Patient has decreased range of motion to the shoulders secondary to pain-passive motion patient is full range of motion with negative crepitus with motion to the left shoulder. Full range of motion to left elbow, left  hand and digits of the left hand.  Lymphadenopathy:    She has no cervical adenopathy.  Neurological: She is alert and oriented to person, place, and time. No cranial nerve deficit. She exhibits normal muscle tone. Coordination normal.  Cranial nerves III-XII grossly intact Strength 5+/5+ to upper extremities bilaterally with resistance applied, equal distribution noted Strength intact to MCP, PIP, DIP joints of left hand Sensation intact with differentiation sharp and dull touch Equal grip strength bilaterally Negative facial droop Negative slurred speech Negative aphasia Negative arm drift Fine motor skills intact Gait proper, proper balance - negative sway, negative drift, negative step-offs    Skin: Skin is warm and dry. She is not diaphoretic.  Psychiatric: She has a normal mood and affect. Her behavior is normal.    ED Course  Procedures (including critical care time) DIAGNOSTIC STUDIES: Oxygen Saturation is 95% on RA, adequate by my interpretation.    COORDINATION OF CARE:    Labs Review Labs Reviewed  BASIC METABOLIC PANEL - Abnormal; Notable for the following:    GFR calc non Af Amer 69 (*)    GFR calc Af Amer 79 (*)    All other components within normal limits  TROPONIN I  CBC    Imaging Review Dg Shoulder Left  09/11/2013   CLINICAL DATA:  Sharp posterior left shoulder pain radiating to the left arm.  EXAM: LEFT SHOULDER - 2+ VIEW  COMPARISON:  08/27/2013  FINDINGS: Degenerative changes in the acromioclavicular joint with small subacromial spurring. No evidence of acute fracture or dislocation. No focal bone lesion or bone destruction. Soft tissues are unremarkable.  IMPRESSION: Degenerative changes in the acromioclavicular joint. No acute bony abnormalities.   Electronically Signed   By: Lucienne Capers M.D.   On: 09/11/2013 22:21     EKG Interpretation   Date/Time:  Friday September 11 2013 20:23:14 EDT Ventricular Rate:  60 PR Interval:  164 QRS  Duration: 82 QT Interval:  438 QTC Calculation: 438 R Axis:   26 Text Interpretation:  Normal sinus rhythm Nonspecific T wave abnormality  Abnormal ECG No significant change since last tracing Confirmed by [facility]   MD, MARTHA [phone]) on 09/11/2013 10:32:00 PM      MDM   Final diagnoses:  Left shoulder pain  Muscular pain    Medications - No data to display  Filed Vitals:   09/11/13 1728 09/11/13 2310  BP: 149/85 153/83  Pulse: 61 60  Temp: 97.5 F (36.4 C)   TempSrc: Oral   Height: 5\' 5"  (1.651 m)   Weight: 187 lb (84.823 kg)   SpO2: 95% 98%   I personally performed the services described in this documentation, which was scribed in my presence. The recorded information has been reviewed and is accurate.  This provider reviewed patient's chart. Patient seen and assessed in ED setting on 08/27/2013 regarding left shoulder pain. EKG, labs and imaging were unremarkable. Patient was diagnosed with chest wall pain and discharge home with muscle relaxer. Patient was seen and assessed [facility] where patient was diagnosed with left shoulder tendinitis and discharged with prednisone. Patient reports back to the ED today due to pain not changing.  EKG noted normal sinus rhythm with a heart rate of 60 beats per minute-nonspecific T wave abnormality noted-acute change since last tracing. Troponin negative elevation. CBC unremarkable. BMP unremarkable. No gap identified. Left shoulder degenerative changes in the acromioclavicular joint noted-no acute bony abnormalities. Doubt septic joint. Doubt arthritis. Negative signs of ischemia. Pain upon palpation and pain with motion- muscular in nature. Negative focal neurological deficits noted. Patient vascularly intact. Patient has little pain with passive motion to the right arm. Patient stable, afebrile. Patient not septic appearing. Discharged patient. Referred to PCP and orthopedics. Discussed with patient to  stretch, massage, heat. Discussed with patient to closely monitor symptoms and if symptoms are to worsen or change to report back to the ED - strict return instructions given.  The patient indicates understanding of these issues and agrees with the plan.  [employer], PA-C 09/12/13 [phone]

## 2013-09-12 NOTE — ED Provider Notes (Signed)
Medical screening examination/treatment/procedure(s) were performed by non-physician practitioner and as supervising physician I was immediately available for consultation/collaboration.   EKG Interpretation   Date/Time:  Friday September 11 2013 20:23:14 EDT Ventricular Rate:  60 PR Interval:  164 QRS Duration: 82 QT Interval:  438 QTC Calculation: 438 R Axis:   26 Text Interpretation:  Normal sinus rhythm Nonspecific T wave abnormality  Abnormal ECG No significant change since last tracing Confirmed by [facility]   MD, Santi Troung [phone]) on 09/11/2013 10:32:00 PM       [name], MD 09/12/13 [phone]

## 2013-10-20 ENCOUNTER — Ambulatory Visit (INDEPENDENT_AMBULATORY_CARE_PROVIDER_SITE_OTHER): Payer: Self-pay | Admitting: Family Medicine

## 2013-10-20 VITALS — BP 140/94 | HR 68 | Temp 98.6°F | Resp 16 | Ht 65.0 in | Wt 193.0 lb

## 2013-10-20 DIAGNOSIS — R5382 Chronic fatigue, unspecified: Secondary | ICD-10-CM

## 2013-10-20 DIAGNOSIS — S43422D Sprain of left rotator cuff capsule, subsequent encounter: Secondary | ICD-10-CM

## 2013-10-20 LAB — POCT SEDIMENTATION RATE: POCT SED RATE: 44 mm/hr — AB (ref 0–22)

## 2013-10-20 LAB — TSH: TSH: 3.059 u[IU]/mL (ref 0.350–4.500)

## 2013-10-20 MED ORDER — TRAMADOL HCL 50 MG PO TABS: 50.0000 mg | ORAL_TABLET | Freq: Three times a day (TID) | ORAL | Status: DC | PRN

## 2013-10-20 MED ORDER — PREDNISONE 20 MG PO TABS: 40.0000 mg | ORAL_TABLET | Freq: Every day | ORAL | Status: DC

## 2013-10-20 NOTE — Progress Notes (Signed)
This chart was scribed for [name], MD by Terressa Koyanagi, ED Scribe at [facility]. This patient was seen in room Room 10 and the patient's care was started at 8:15 AM.  Patient ID: Kelly Rosales MRN: [id number], DOB: Dec 30, 1950, 63 y.o. Date of Encounter: 10/20/2013, 8:15 AM  Primary Physician: Tyrone Schimke, MD Chief Complaint  Patient presents with  . Follow-up    Low back and left shoulder  . Alopecia    Wants thyroid checked    HPI: 63 y.o. year old female with history below presents for a follow-up on sudden onset, waxing and waning left shoulder pain onset 3 months ago. Pt states the pain is worst when she has to raise her left arm to put on her clothes. Pt has been icing the area with minimal relief. Pt states that the pain has been keeping her up at night and she has been taking sleeping aids to sleep. Pt states that her daily activities are interrupted due to the pain. Pt complains of associated lethargy and restlessness. Pt denies any pain on her right shoulder or arm.   Thyroids: Pt also wants to know if anything is "going on" with her thyroids.   08/27/13 & 09/11/13: Pt presented to the ED on 08/27/13 and 09/11/13 for her left shoulder pain. A complete work up was done at the ED with following results:   ED Note (09/11/13) --  This provider reviewed patient's chart. Patient seen and assessed in ED setting on 08/27/2013 regarding left shoulder pain. EKG, labs and imaging were unremarkable. Patient was diagnosed with chest wall pain and discharge home with muscle relaxer. Patient was seen and assessed by Dr[facility]a [facility] where patient was diagnosed with left shoulder tendinitis and discharged with prednisone. Patient reports back to the ED today due to pain not changing.  EKG noted normal sinus rhythm with a heart rate of 60 beats per minute-nonspecific T wave abnormality noted-acute change since last tracing. Troponin negative  elevation. CBC unremarkable. BMP unremarkable. No gap identified. Left shoulder degenerative changes in the acromioclavicular joint noted-no acute bony abnormalities.  Doubt septic joint. Doubt arthritis. Negative signs of ischemia. Pain upon palpation and pain with motion- muscular in nature. Negative focal neurological deficits noted. Patient vascularly intact. Patient has little pain with passive motion to the right arm. Patient stable, afebrile. Patient not septic appearing. Discharged patient. Referred to PCP and orthopedics. Discussed with patient to stretch, massage, heat. Discussed with patient to closely monitor symptoms and if symptoms are to worsen or change to report back to the ED - strict return instructions given.  The patient indicates understanding of these issues and agrees with the plan.  [name], PA-C  09/12/13 0310  Past Medical History  Diagnosis Date  . Achilles tendonitis 10/2010  . Ankle deformity 10/2010  . Peripheral neuropathy 10/2010    Secondary to Lumbar Radiculopathy  . Lumbar radiculopathy 10/2010  . Chronic leg pain     left  . Chronic back pain   . Chronic neck pain   . Chronic abdominal pain   . IBS (irritable bowel syndrome)      Home Meds: Prior to Admission medications   Medication Sig Start Date End Date Taking? Authorizing Provider  albuterol (PROVENTIL HFA;VENTOLIN HFA) 108 (90 BASE) MCG/ACT inhaler Inhale 2 puffs into the lungs every 6 (six) hours as needed for wheezing or shortness of breath.   Yes Historical Provider, MD  hydrochlorothiazide (HYDRODIURIL)  25 MG tablet Take 25 mg by mouth daily as needed (fluid).   Yes Historical Provider, MD  methocarbamol (ROBAXIN) 500 MG tablet Take 500 mg by mouth 2 (two) times daily as needed for muscle spasms. 08/27/13   Francine Graven, DO    Allergies:  Allergies  Allergen Reactions  . Demerol [Meperidine] Nausea And Vomiting  . Percocet [Oxycodone-Acetaminophen] Nausea And Vomiting  .  Penicillins Rash    History   Social History  . Marital Status: Married    Spouse Name: N/A    Number of Children: N/A  . Years of Education: N/A   Occupational History  . Not on file.   Social History Main Topics  . Smoking status: Never Smoker   . Smokeless tobacco: Not on file  . Alcohol Use: No  . Drug Use: No  . Sexual Activity: Not on file   Other Topics Concern  . Not on file   Social History Narrative  . No narrative on file    Review of Systems: Constitutional: negative for chills, fever, night sweats, weight changes, or fatigue  HEENT: negative for vision changes, hearing loss, congestion, rhinorrhea, ST, epistaxis, or sinus pressure Musc: Positive for left shoulder pain.  Cardiovascular: negative for chest pain or palpitations Respiratory: negative for hemoptysis, wheezing, shortness of breath, or cough Abdominal: negative for abdominal pain, nausea, vomiting, diarrhea, or constipation Dermatological: negative for rash Neurologic: negative for headache, dizziness, or syncope All other systems reviewed and are otherwise negative with the exception to those above and in the HPI.   Physical Exam: Triage Vitals: Blood pressure 140/94, pulse 68, temperature 98.6 F (37 C), temperature source Oral, resp. rate 16, height 5\' 5"  (1.651 m), weight 193 lb (87.544 kg), SpO2 95.00%., Body mass index is 32.12 kg/(m^2). General: Well developed, well nourished, in no acute distress. Head: Normocephalic, atraumatic, eyes without discharge, sclera non-icteric, nares are without discharge. Bilateral auditory canals clear, TM's are without perforation, pearly grey and translucent with reflective cone of light bilaterally. Oral cavity moist, posterior pharynx without exudate, erythema, peritonsillar abscess, or post nasal drip.  Neck: Supple. No thyromegaly. Full ROM. No lymphadenopathy. Lungs: Clear bilaterally to auscultation without wheezes, rales, or rhonchi. Breathing is  unlabored. Heart: RRR with S1 S2. No murmurs, rubs, or gallops appreciated. Abdomen: Soft, non-tender, non-distended with normoactive bowel sounds. No hepatomegaly. No rebound/guarding. No obvious abdominal masses. Msk:  Strength and tone normal for age. Extremities/Skin: Warm and dry. No clubbing or cyanosis. No edema. No rashes or suspicious lesions.  Pain with abduction, internal and external rotation of left shoulder but does have full passive ROM Neuro: Alert and oriented X 3. Moves all extremities spontaneously. Gait is normal. CNII-XII grossly in tact. Psych:  Responds to questions appropriately with a normal affect.   Labs:   ASSESSMENT AND PLAN:  DIAGNOSTIC STUDIES: Oxygen Saturation is 95% on RA, adequate by my interpretation.    COORDINATION OF CARE: 8:25 AM-Discussed treatment plan which includes pain meds (tramadol), cortisol, ortho referral, MRI, labs, with pt at bedside and pt agreed to plan.   63 y.o. year old female with  Rotator cuff (capsule) sprain, left, subsequent encounter - Plan: predniSONE (DELTASONE) 20 MG tablet, POCT SEDIMENTATION RATE, MR Shoulder Left Wo Contrast, AMB referral to orthopedics, traMADol (ULTRAM) 50 MG tablet, DISCONTINUED: traMADol (ULTRAM) 50 MG tablet  Chronic fatigue - Plan: TSH    Signed, [name], MD 10/20/2013 8:15 AM

## 2013-10-23 ENCOUNTER — Telehealth: Payer: Self-pay

## 2013-10-23 NOTE — Telephone Encounter (Signed)
Pt still feeling very tired and sluggish. Please advise what she should do. Thanks,

## 2013-10-24 ENCOUNTER — Other Ambulatory Visit: Payer: Self-pay | Admitting: Family Medicine

## 2013-10-24 DIAGNOSIS — R5382 Chronic fatigue, unspecified: Secondary | ICD-10-CM

## 2013-10-26 NOTE — Telephone Encounter (Signed)
Pt states that her shoulder is still hurting and medication is not helping.  Pt will be in tom.

## 2013-10-27 ENCOUNTER — Emergency Department (HOSPITAL_COMMUNITY)
Admission: EM | Admit: 2013-10-27 | Discharge: 2013-10-27 | Disposition: A | Discharge status: Home / Self Care | Payer: BC Managed Care – PPO | Attending: Emergency Medicine | Admitting: Emergency Medicine

## 2013-10-27 ENCOUNTER — Encounter (HOSPITAL_COMMUNITY): Payer: Self-pay | Admitting: Emergency Medicine

## 2013-10-27 DIAGNOSIS — G8929 Other chronic pain: Secondary | ICD-10-CM | POA: Insufficient documentation

## 2013-10-27 DIAGNOSIS — R079 Chest pain, unspecified: Secondary | ICD-10-CM | POA: Insufficient documentation

## 2013-10-27 DIAGNOSIS — Z8669 Personal history of other diseases of the nervous system and sense organs: Secondary | ICD-10-CM | POA: Insufficient documentation

## 2013-10-27 DIAGNOSIS — Z880 Allergy status to penicillin: Secondary | ICD-10-CM | POA: Insufficient documentation

## 2013-10-27 DIAGNOSIS — Z79899 Other long term (current) drug therapy: Secondary | ICD-10-CM | POA: Insufficient documentation

## 2013-10-27 DIAGNOSIS — Z8719 Personal history of other diseases of the digestive system: Secondary | ICD-10-CM | POA: Insufficient documentation

## 2013-10-27 DIAGNOSIS — Z7951 Long term (current) use of inhaled steroids: Secondary | ICD-10-CM | POA: Insufficient documentation

## 2013-10-27 DIAGNOSIS — M25512 Pain in left shoulder: Secondary | ICD-10-CM | POA: Insufficient documentation

## 2013-10-27 NOTE — ED Notes (Signed)
Refuses labs; states she only wants an MRI. Spoke with Dr. Colin Rhein and this nurse at length and the conclusion is she does not want medication, she will not try anti-inflammatories, she knows from Googling that her problem is a rotator cuff injury and she wants an MRI. If we are not going to do an MRI today, there is nothing she wants from [place] and she is ready to leave. She told Dr. Colin Rhein she did not want any medical advice from him either without him ordering an MRI.

## 2013-10-27 NOTE — ED Provider Notes (Signed)
CSN: [id number]     Arrival date & time 10/27/13  1418 History   First MD Initiated Contact with Patient 10/27/13 1613     Chief Complaint  Patient presents with  . Shoulder Pain     (Consider location/radiation/quality/duration/timing/severity/associated sxs/prior Treatment) Patient is a 63 y.o. female presenting with shoulder pain.  Shoulder Pain This is a chronic problem. Episode onset: 4 months ago. The problem occurs constantly. The problem has been gradually worsening. Associated symptoms include chest pain (intermittent, not currently present). Pertinent negatives include no abdominal pain, no headaches and no shortness of breath. Nothing aggravates the symptoms. Nothing relieves the symptoms.    Past Medical History  Diagnosis Date  . Achilles tendonitis 10/2010  . Ankle deformity 10/2010  . Peripheral neuropathy 10/2010    Secondary to Lumbar Radiculopathy  . Lumbar radiculopathy 10/2010  . Chronic leg pain     left  . Chronic back pain   . Chronic neck pain   . Chronic abdominal pain   . IBS (irritable bowel syndrome)    Past Surgical History  Procedure Laterality Date  . [place] bunionectomy Left 2003  . Cotton osteotomy w/ graft Left 04/10/12  . Tal lengthening Left 04/10/2012  . Nail matrixectomy Left 04/10/12  . Foot surgery     Family History  Problem Relation Age of Onset  . Cancer Mother   . Diabetes Mother   . Heart disease Mother   . Hyperlipidemia Mother   . Hypertension Mother    History  Substance Use Topics  . Smoking status: Never Smoker   . Smokeless tobacco: Not on file  . Alcohol Use: No   OB History   Grav Para Term Preterm Abortions TAB SAB Ect Mult Living                 Review of Systems  Respiratory: Negative for shortness of breath.   Cardiovascular: Positive for chest pain (intermittent, not currently present).  Gastrointestinal: Negative for abdominal pain.  Neurological: Negative for headaches.  All other systems reviewed and  are negative.     Allergies  Demerol; Percocet; and Penicillins  Home Medications   Prior to Admission medications   Medication Sig Start Date End Date Taking? Authorizing Provider  albuterol (PROVENTIL HFA;VENTOLIN HFA) 108 (90 BASE) MCG/ACT inhaler Inhale 2 puffs into the lungs every 6 (six) hours as needed for wheezing or shortness of breath.    Historical Provider, MD  hydrochlorothiazide (HYDRODIURIL) 25 MG tablet Take 25 mg by mouth daily as needed (fluid).    Historical Provider, MD  methocarbamol (ROBAXIN) 500 MG tablet Take 500 mg by mouth 2 (two) times daily as needed for muscle spasms. 08/27/13   Francine Graven, DO  predniSONE (DELTASONE) 20 MG tablet Take 2 tablets (40 mg total) by mouth daily. 10/20/13   [name], MD  traMADol (ULTRAM) 50 MG tablet Take 1 tablet (50 mg total) by mouth every 8 (eight) hours as needed. 10/20/13   [name], MD   BP 144/96  Pulse 67  Temp(Src) 98.5 F (36.9 C) (Oral)  Resp 20  SpO2 99% Physical Exam  Vitals reviewed. Constitutional: She is oriented to person, place, and time. She appears well-developed and well-nourished.  HENT:  Head: Normocephalic and atraumatic.  Right Ear: External ear normal.  Left Ear: External ear normal.  Eyes: Conjunctivae and EOM are normal. Pupils are equal, round, and reactive to light.  Neck: Normal range of motion. Neck supple.  Cardiovascular: Normal rate, regular  rhythm, normal heart sounds and intact distal pulses.   Pulmonary/Chest: Effort normal and breath sounds normal.  Abdominal: Soft. Bowel sounds are normal. There is no tenderness.  Musculoskeletal:       Left shoulder: She exhibits decreased range of motion (2/2 pain, full PROM), tenderness and bony tenderness. She exhibits no swelling, no effusion, no crepitus and no deformity.  Neurological: She is alert and oriented to person, place, and time.  Skin: Skin is warm and dry.    ED Course  Procedures (including critical care  time) Labs Review Labs Reviewed  CBC WITH DIFFERENTIAL  COMPREHENSIVE METABOLIC PANEL  I-STAT [place], ED    Imaging Review No results found.   EKG Interpretation   Date/Time:  Tuesday October 27 2013 14:37:47 EDT Ventricular Rate:  79 PR Interval:  156 QRS Duration: 72 QT Interval:  400 QTC Calculation: 458 R Axis:     Text Interpretation:  Normal sinus rhythm Cannot rule out Anterior infarct  , age undetermined Abnormal ECG No significant change was found Confirmed  by Debby Freiberg [id number]) on 10/27/2013 4:14:09 PM      MDM   Final diagnoses:  Shoulder joint pain, left    63 y.o. female with pertinent PMH of chronic L shoulder pain x 4 months with negative xr and NSU evalu, with radiculopathy presents with continued L shoulder pain.  She specifically denies acute change in pain and specifically requests an MRI as she states her pain "is not arthritis, I know they told me that, but it is not."  She has been seen on numerous occasions for similar symptoms both by this emergency department and by her primary care Dr. with x-rays repeatedly showing arthritis as well as her having a history of arthritis of her hands.  She states that she has had some chest pain, however this has been present on prior exams with negative workup. No recent change. EKG today unchanged.  On arrival today physical exam and vitals as above. Pain exactly reproduced with movement of left shoulder. Her physical exam is consistent with osteoarthritis. Examined rotator cuff which does not appear to be acutely injured at this time. Patient has been given orthopedic referral in the past however refuses to followup.  I discussed with patient that there is no indication for emergent MRI of her shoulder at this time, offered referral to orthopedics and attempted to discuss therapeutic strategy with patient, however she stated that she did not want any of our advice and that she had done Internet research and was  sure that it was her rotator cuff.  She similarly denied orthopedic referral.  DC home in stable condition.  1. Shoulder joint pain, left         Debby Freiberg, MD 10/27/13 [phone]

## 2013-10-27 NOTE — ED Notes (Addendum)
Pt in c/o left shoulder pain, states this has been going on for the last several months and she has been dx with arthritis vs rotator cuff injury, pt states symptoms continue and she wants to be re-evaluated for this. Denies injury. Pt has had an xray done on her shoulder already and doesn't want that repeated, pt states she wants an MRI.

## 2013-10-27 NOTE — Discharge Instructions (Signed)
Arthralgia °Your caregiver has diagnosed you as suffering from an arthralgia. Arthralgia means there is pain in a joint. This can come from many reasons including: °· Bruising the joint which causes soreness (inflammation) in the joint. °· Wear and tear on the joints which occur as we grow older (osteoarthritis). °· Overusing the joint. °· Various forms of arthritis. °· Infections of the joint. °Regardless of the cause of pain in your joint, most of these different pains respond to anti-inflammatory drugs and rest. The exception to this is when a joint is infected, and these cases are treated with antibiotics, if it is a bacterial infection. °HOME CARE INSTRUCTIONS  °· Rest the injured area for as long as directed by your caregiver. Then slowly start using the joint as directed by your caregiver and as the pain allows. Crutches as directed may be useful if the ankles, knees or hips are involved. If the knee was splinted or casted, continue use and care as directed. If an stretchy or elastic wrapping bandage has been applied today, it should be removed and re-applied every 3 to 4 hours. It should not be applied tightly, but firmly enough to keep swelling down. Watch toes and feet for swelling, bluish discoloration, coldness, numbness or excessive pain. If any of these problems (symptoms) occur, remove the ace bandage and re-apply more loosely. If these symptoms persist, contact your caregiver or return to this location. °· For the first 24 hours, keep the injured extremity elevated on pillows while lying down. °· Apply ice for 15-20 minutes to the sore joint every couple hours while awake for the first half day. Then 03-04 times per day for the first 48 hours. Put the ice in a plastic bag and place a towel between the bag of ice and your skin. °· Wear any splinting, casting, elastic bandage applications, or slings as instructed. °· Only take over-the-counter or prescription medicines for pain, discomfort, or fever as  directed by your caregiver. Do not use aspirin immediately after the injury unless instructed by your physician. Aspirin can cause increased bleeding and bruising of the tissues. °· If you were given crutches, continue to use them as instructed and do not resume weight bearing on the sore joint until instructed. °Persistent pain and inability to use the sore joint as directed for more than 2 to 3 days are warning signs indicating that you should see a caregiver for a follow-up visit as soon as possible. Initially, a hairline fracture (break in bone) may not be evident on X-rays. Persistent pain and swelling indicate that further evaluation, non-weight bearing or use of the joint (use of crutches or slings as instructed), or further X-rays are indicated. X-rays may sometimes not show a small fracture until a week or 10 days later. Make a follow-up appointment with your own caregiver or one to whom we have referred you. A radiologist (specialist in reading X-rays) may read your X-rays. Make sure you know how you are to obtain your X-ray results. Do not assume everything is normal if you do not hear from us. °SEEK MEDICAL CARE IF: °Bruising, swelling, or pain increases. °SEEK IMMEDIATE MEDICAL CARE IF:  °· Your fingers or toes are numb or blue. °· The pain is not responding to medications and continues to stay the same or get worse. °· The pain in your joint becomes severe. °· You develop a fever over 102° F (38.9° C). °· It becomes impossible to move or use the joint. °MAKE SURE YOU:  °·   Understand these instructions. °· Will watch your condition. °· Will get help right away if you are not doing well or get worse. °Document Released: 12/25/2004 Document Revised: 03/19/2011 Document Reviewed: 08/13/2007 °ExitCare® Patient Information ©2015 [employer]. This information is not intended to replace advice given to you by your health care provider. Make sure you discuss any questions you have with your health care  provider. ° °Arthritis, Nonspecific °Arthritis is inflammation of a joint. This usually means pain, redness, warmth or swelling are present. One or more joints may be involved. There are a number of types of arthritis. Your caregiver may not be able to tell what type of arthritis you have right away. °CAUSES  °The most common cause of arthritis is the wear and tear on the joint (osteoarthritis). This causes damage to the cartilage, which can break down over time. The knees, hips, back and neck are most often affected by this type of arthritis. °Other types of arthritis and common causes of joint pain include: °· Sprains and other injuries near the joint. Sometimes minor sprains and injuries cause pain and swelling that develop hours later. °· Rheumatoid arthritis. This affects hands, feet and knees. It usually affects both sides of your body at the same time. It is often associated with chronic ailments, fever, weight loss and general weakness. °· Crystal arthritis. Gout and pseudo gout can cause occasional acute severe pain, redness and swelling in the foot, ankle, or knee. °· Infectious arthritis. Bacteria can get into a joint through a break in overlying skin. This can cause infection of the joint. Bacteria and viruses can also spread through the blood and affect your joints. °· Drug, infectious and allergy reactions. Sometimes joints can become mildly painful and slightly swollen with these types of illnesses. °SYMPTOMS  °· Pain is the main symptom. °· Your joint or joints can also be red, swollen and warm or hot to the touch. °· You may have a fever with certain types of arthritis, or even feel overall ill. °· The joint with arthritis will hurt with movement. Stiffness is present with some types of arthritis. °DIAGNOSIS  °Your caregiver will suspect arthritis based on your description of your symptoms and on your exam. Testing may be needed to find the type of arthritis: °· Blood and sometimes urine  tests. °· X-ray tests and sometimes CT or MRI scans. °· Removal of fluid from the joint (arthrocentesis) is done to check for bacteria, crystals or other causes. Your caregiver (or a specialist) will numb the area over the joint with a local anesthetic, and use a needle to remove joint fluid for examination. This procedure is only minimally uncomfortable. °· Even with these tests, your caregiver may not be able to tell what kind of arthritis you have. Consultation with a specialist (rheumatologist) may be helpful. °TREATMENT  °Your caregiver will discuss with you treatment specific to your type of arthritis. If the specific type cannot be determined, then the following general recommendations may apply. °Treatment of severe joint pain includes: °· Rest. °· Elevation. °· Anti-inflammatory medication (for example, ibuprofen) may be prescribed. Avoiding activities that cause increased pain. °· Only take over-the-counter or prescription medicines for pain and discomfort as recommended by your caregiver. °· Cold packs over an inflamed joint may be used for 10 to 15 minutes every hour. Hot packs sometimes feel better, but do not use overnight. Do not use hot packs if you are diabetic without your caregiver's permission. °· A cortisone shot into arthritic   joints may help reduce pain and swelling. °· Any acute arthritis that gets worse over the next 1 to 2 days needs to be looked at to be sure there is no joint infection. °Long-term arthritis treatment involves modifying activities and lifestyle to reduce joint stress jarring. This can include weight loss. Also, exercise is needed to nourish the joint cartilage and remove waste. This helps keep the muscles around the joint strong. °HOME CARE INSTRUCTIONS  °· Do not take aspirin to relieve pain if gout is suspected. This elevates uric acid levels. °· Only take over-the-counter or prescription medicines for pain, discomfort or fever as directed by your caregiver. °· Rest the  joint as much as possible. °· If your joint is swollen, keep it elevated. °· Use crutches if the painful joint is in your leg. °· Drinking plenty of fluids may help for certain types of arthritis. °· Follow your caregiver's dietary instructions. °· Try low-impact exercise such as: °¨ Swimming. °¨ Water aerobics. °¨ Biking. °¨ Walking. °· Morning stiffness is often relieved by a warm shower. °· Put your joints through regular range-of-motion. °SEEK MEDICAL CARE IF:  °· You do not feel better in 24 hours or are getting worse. °· You have side effects to medications, or are not getting better with treatment. °SEEK IMMEDIATE MEDICAL CARE IF:  °· You have a fever. °· You develop severe joint pain, swelling or redness. °· Many joints are involved and become painful and swollen. °· There is severe back pain and/or leg weakness. °· You have loss of bowel or bladder control. °Document Released: 02/02/2004 Document Revised: 03/19/2011 Document Reviewed: 02/18/2008 °ExitCare® Patient Information ©2015 [employer]. This information is not intended to replace advice given to you by your health care provider. Make sure you discuss any questions you have with your health care provider. ° °

## 2013-11-09 ENCOUNTER — Ambulatory Visit
Admission: RE | Admit: 2013-11-09 | Discharge: 2013-11-09 | Disposition: A | Discharge status: Home / Self Care | Payer: BC Managed Care – PPO | Source: Ambulatory Visit | Attending: Family Medicine | Admitting: Family Medicine

## 2013-11-09 DIAGNOSIS — S43422D Sprain of left rotator cuff capsule, subsequent encounter: Secondary | ICD-10-CM

## 2013-11-10 ENCOUNTER — Telehealth: Payer: Self-pay | Admitting: Family Medicine

## 2013-11-10 NOTE — Telephone Encounter (Signed)
Patient came in office for MRI results. Results given. See report, needs to f/u with ortho.  Ortho referral  placed 10/13 can we check status of referral and let her know please. She has not heard anything regarding appointment.

## 2013-11-11 NOTE — Telephone Encounter (Signed)
Tried calling patient back no voicemail set up or its full, to ask what her new insurance is to schedule with [facility]. Her [employer] has been terminated (checked with [employer] website and [facility] verified)  and they need to know whats the insurance now or if self pay. If she calls back please get insurance info and best telephone number. This one was the one I used: [phone]  home number is disconnected.

## 2013-11-17 ENCOUNTER — Ambulatory Visit (INDEPENDENT_AMBULATORY_CARE_PROVIDER_SITE_OTHER): Payer: Self-pay | Admitting: Family Medicine

## 2013-11-17 VITALS — BP 172/104 | HR 67 | Temp 98.6°F | Resp 20 | Ht 66.0 in | Wt 199.6 lb

## 2013-11-17 DIAGNOSIS — R059 Cough, unspecified: Secondary | ICD-10-CM

## 2013-11-17 DIAGNOSIS — R05 Cough: Secondary | ICD-10-CM

## 2013-11-17 DIAGNOSIS — S43422A Sprain of left rotator cuff capsule, initial encounter: Secondary | ICD-10-CM

## 2013-11-17 DIAGNOSIS — I10 Essential (primary) hypertension: Secondary | ICD-10-CM

## 2013-11-17 MED ORDER — METHOCARBAMOL 500 MG PO TABS: 500.0000 mg | ORAL_TABLET | Freq: Three times a day (TID) | ORAL | Status: DC | PRN

## 2013-11-17 MED ORDER — BENZONATATE 200 MG PO CAPS: 200.0000 mg | ORAL_CAPSULE | Freq: Two times a day (BID) | ORAL | Status: DC | PRN

## 2013-11-17 MED ORDER — HYDROCODONE-ACETAMINOPHEN 5-325 MG PO TABS: 1.0000 | ORAL_TABLET | Freq: Two times a day (BID) | ORAL | Status: DC | PRN

## 2013-11-17 NOTE — Patient Instructions (Signed)
CLINICAL DATA: Left shoulder pain, left arm pain, limited range of motion  EXAM: MRI OF THE LEFT SHOULDER WITHOUT CONTRAST  TECHNIQUE: Multiplanar, multisequence MR imaging of the shoulder was performed. No intravenous contrast was administered.  COMPARISON: None.  FINDINGS: Rotator cuff: Moderate insertional tendinosis of the supraspinatus tendon. Moderate insertional tendinosis of the infraspinatus tendon. Teres minor tendon is intact. Subscapularis tendon is intact.  Muscles: No atrophy or fatty replacement of nor abnormal signal within, the muscles of the rotator cuff.  Biceps long head: Intact.  Acromioclavicular Joint: Moderate degenerative changes of the acromioclavicular joint. Small amount of subacromial/subdeltoid bursal fluid. Type I-II acromion.  Glenohumeral Joint: No joint effusion. No chondral defect. No dislocation.  Labrum: Grossly intact, but evaluation is limited by lack of intraarticular fluid.  Bones: No marrow signal abnormality. No fracture or dislocation.  IMPRESSION: 1. Moderate insertional tendinosis of the supraspinatus tendon. 2. Moderate insertional tendinosis of the infraspinatus tendon. 3. Mild subacromial/subdeltoid bursitis. 4. Moderate degenerative changes of the acromioclavicular joint.   Electronically Signed  By: Kathreen Devoid  On: 11/09/2013 13:54

## 2013-11-17 NOTE — Progress Notes (Signed)
Subjective:    Patient ID: Kelly Rosales, female    DOB: 11/26/1950, 63 y.o.   MRN: [id number] This chart was scribed for [name], MD by Zola Button, Medical Scribe. This patient was seen in Room 3 and the patient's care was started at 1:19 PM.   HPI HPI Comments: Note from 10/20/13: 63 y.o. year old female with history below presents for a follow-up on sudden onset, waxing and waning left shoulder pain onset 3 months ago. Pt states the pain is worst when she has to raise her left arm to put on her clothes. Pt has been icing the area with minimal relief. Pt states that the pain has been keeping her up at night and she has been taking sleeping aids to sleep. Pt states that her daily activities are interrupted due to the pain. Pt complains of associated lethargy and restlessness. Pt denies any pain on her right shoulder or arm.  Thyroids: Pt also wants to know if anything is "going on" with her thyroids.  08/27/13 & 09/11/13: Pt presented to the ED on 08/27/13 and 09/11/13 for her left shoulder pain. A complete work up was done at the ED with following results:  ED Note (09/11/13) --  This provider reviewed patient's chart. Patient seen and assessed in ED setting on 08/27/2013 regarding left shoulder pain. EKG, labs and imaging were unremarkable. Patient was diagnosed with chest wall pain and discharge home with muscle relaxer. Patient was [facility] and [facility] patient was diagnosed with left shoulder tendinitis and discharged with prednisone. Patient reports back to the ED today due to pain not changing.  EKG noted normal sinus rhythm with a heart rate of 60 beats per minute-nonspecific T wave abnormality noted-acute change since last tracing. Troponin negative elevation. CBC unremarkable. BMP unremarkable. No gap identified. Left shoulder degenerative changes in the acromioclavicular joint noted-no acute bony abnormalities.  Doubt septic joint.  Doubt arthritis. Negative signs of ischemia. Pain upon palpation and pain with motion- muscular in nature. Negative focal neurological deficits noted. Patient vascularly intact. Patient has little pain with passive motion to the right arm. Patient stable, afebrile. Patient not septic appearing. Discharged patient. Referred to PCP and orthopedics. Discussed with patient to stretch, massage, heat. Discussed with patient to closely monitor symptoms and if symptoms are to worsen or change to report back to the ED - strict return instructions given.  The patient indicates understanding of these issues and agrees with the plan.  [name], PA-C  09/12/13 0310  This visit: Kelly Rosales is a 63 y.o. female who presents to the [facility] for a follow-up for her left shoulder pain. Her shoulder still has pain and swelling and she notes that her shoulder pain sometimes radiates to her neck. She notes pain with arm movement. Patient has difficulty sleeping due to the pain. She states the Prednisone and Tramadol did not provide relief to her pain. She states that muscle relaxer did provide some relief. Patient has also tried heating pads and some type of topical cream but without relief. She has had an MRI done. Patient has [employer] for insurance. She has allergies to penicillins.  Patient notes having productive cough of phlegm that began about 1 week ago that she thinks may be due to one of her medications. The cough is exacerbated when she is in a room full of people.  A repeat BP was measured in the room  at 132/70.  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  Review of Systems  Constitutional: Negative for fatigue.  HENT: Negative for ear discharge.   Eyes: Negative for discharge.  Respiratory: Negative for cough.   Cardiovascular: Negative for chest pain.  Gastrointestinal: Negative for abdominal pain.  Genitourinary: Negative for hematuria.    Musculoskeletal: Positive for arthralgias and neck pain.  Skin: Negative for rash.  Neurological: Negative for seizures.  Psychiatric/Behavioral: Positive for sleep disturbance.       Objective:   Physical Exam CONSTITUTIONAL: Well developed/well nourished HEAD: Normocephalic/atraumatic EYES: EOM/PERRL ENMT: Mucous membranes moist NECK: supple no meningeal signs SPINE: entire spine nontender CV: S1/S2 noted, no murmurs/rubs/gallops noted LUNGS: Lungs are clear to auscultation bilaterally, no apparent distress ABDOMEN: soft, nontender, no rebound or guarding GU: no cva tenderness NEURO: Pt is awake/alert, moves all extremitiesx4 EXTREMITIES: pulses normal SKIN: warm, color normal PSYCH: no abnormalities of mood noted  Results for orders placed or performed in visit on 10/20/13  TSH  Result Value Ref Range   TSH 3.059 0.350 - 4.500 uIU/mL  POCT SEDIMENTATION RATE  Result Value Ref Range   POCT SED RATE 44 (A) 0 - 22 mm/hr   We reviewed the importance of moving the shoulder to avoid getting a frozen shoulder  Blood pressure record check was 132/70    Assessment & Plan:   Rotator cuff (capsule) sprain, left, initial encounter - Plan: methocarbamol (ROBAXIN) 500 MG tablet, HYDROcodone-acetaminophen (NORCO) 5-325 MG per tablet, Ambulatory referral to Orthopedic Surgery  Essential hypertension  Cough - Plan: benzonatate (TESSALON) 200 MG capsule  Signed, [name], MD

## 2013-12-24 ENCOUNTER — Other Ambulatory Visit: Payer: Self-pay | Admitting: Radiology

## 2013-12-24 DIAGNOSIS — M25512 Pain in left shoulder: Secondary | ICD-10-CM

## 2014-01-20 ENCOUNTER — Ambulatory Visit: Payer: BC Managed Care – PPO | Admitting: Internal Medicine

## 2014-03-10 ENCOUNTER — Ambulatory Visit (INDEPENDENT_AMBULATORY_CARE_PROVIDER_SITE_OTHER): Payer: BLUE CROSS/BLUE SHIELD | Admitting: Family Medicine

## 2014-03-10 ENCOUNTER — Ambulatory Visit (INDEPENDENT_AMBULATORY_CARE_PROVIDER_SITE_OTHER): Payer: BLUE CROSS/BLUE SHIELD

## 2014-03-10 VITALS — BP 120/80 | HR 69 | Temp 98.4°F | Resp 16 | Ht 66.0 in | Wt 198.0 lb

## 2014-03-10 DIAGNOSIS — S43422D Sprain of left rotator cuff capsule, subsequent encounter: Secondary | ICD-10-CM

## 2014-03-10 DIAGNOSIS — M25512 Pain in left shoulder: Secondary | ICD-10-CM | POA: Diagnosis not present

## 2014-03-10 MED ORDER — PREDNISONE 20 MG PO TABS: 40.0000 mg | ORAL_TABLET | Freq: Every day | ORAL | Status: DC

## 2014-03-10 NOTE — Patient Instructions (Signed)
Rotator Cuff Tendinitis  Rotator cuff tendinitis is inflammation of the tough, cord-like bands that connect muscle to bone (tendons) in your rotator cuff. Your rotator cuff is the collection of all the muscles and tendons that connect your arm to your shoulder. Your rotator cuff holds the head of your upper arm bone (humerus) in the cup (fossa) of your shoulder blade (scapula). CAUSES Rotator cuff tendinitis is usually caused by overusing the joint involved.  SIGNS AND SYMPTOMS  Deep ache in the shoulder also felt on the outside upper arm over the shoulder muscle.  Point tenderness over the area that is injured.  Pain comes on gradually and becomes worse with lifting the arm to the side (abduction) or turning it inward (internal rotation).  May lead to a chronic tear: When a rotator cuff tendon becomes inflamed, it runs the risk of losing its blood supply, causing some tendon fibers to die. This increases the risk that the tendon can fray and partially or completely tear. DIAGNOSIS Rotator cuff tendinitis is diagnosed by taking a medical history, performing a physical exam, and reviewing results of imaging exams. The medical history is useful to help determine the type of rotator cuff injury. The physical exam will include looking at the injured shoulder, feeling the injured area, and watching you do range-of-motion exercises. X-ray exams are typically done to rule out other causes of shoulder pain, such as fractures. MRI is the imaging exam usually used for significant shoulder injuries. Sometimes a dye study called CT arthrogram is done, but it is not as widely used as MRI. In some institutions, special ultrasound tests may also be used to aid in the diagnosis. TREATMENT  Less Severe Cases  Use of a sling to rest the shoulder for a short period of time. Prolonged use of the sling can cause stiffness, weakness, and loss of motion of the shoulder joint.  Anti-inflammatory medicines, such as  ibuprofen or naproxen sodium, may be prescribed. More Severe Cases  Physical therapy.  Use of steroid injections into the shoulder joint.  Surgery. HOME CARE INSTRUCTIONS   Use a sling or splint until the pain decreases. Prolonged use of the sling can cause stiffness, weakness, and loss of motion of the shoulder joint.  Apply ice to the injured area:  Put ice in a plastic bag.  Place a towel between your skin and the bag.  Leave the ice on for 20 minutes, 2-3 times a day.  Try to avoid use other than gentle range of motion while your shoulder is painful. Use the shoulder and exercise only as directed by your health care provider. Stop exercises or range of motion if pain or discomfort increases, unless directed otherwise by your health care provider.  Only take over-the-counter or prescription medicines for pain, discomfort, or fever as directed by your health care provider.  If you were given a shoulder sling and straps (immobilizer), do not remove it except as directed, or until you see a health care provider for a follow-up exam. If you need to remove it, move your arm as little as possible or as directed.  You may want to sleep on several pillows at night to lessen swelling and pain. SEEK IMMEDIATE MEDICAL CARE IF:   Your shoulder pain increases or new pain develops in your arm, hand, or fingers and is not relieved with medicines.  You have new, unexplained symptoms, especially increased numbness in the hands or loss of strength.  You develop any worsening of the problems  that brought you in for care.  Your arm, hand, or fingers are numb or tingling.  Your arm, hand, or fingers are swollen, painful, or turn white or blue. MAKE SURE YOU:  Understand these instructions.  Will watch your condition.  Will get help right away if you are not doing well or get worse. Document Released: 03/17/2003 Document Revised: 10/15/2012 Document Reviewed: 08/06/2012 [facility] Patient  Information 2015 [place], [place]. This information is not intended to replace advice given to you by your health care provider. Make sure you discuss any questions you have with your health care provider.

## 2014-03-10 NOTE — Progress Notes (Signed)
@[facility] @  This chart was scribed for [name], MD by Hilda Lias, ED Scribe. The patient's care was started at 4:55 PM.   Patient ID: Kelly Rosales MRN: [id number], DOB: 09-24-1950, 64 y.o. Date of Encounter: 03/10/2014, 4:55 PM  Primary Physician: Tyrone Schimke, MD  Chief Complaint:  Chief Complaint  Patient presents with  . Shoulder Pain    Follow up Left shoulder pain     HPI: 64 y.o. year old female with history below presents with worsening left shoulder pain that radiates into the left side of the neck and through the left upper arm to the left elbow that has been present for several months. Pt states that raising her arms straight up overhead creates the most pain, but pain is associated with most movements. Pt also notes swelling underneath the arms when she moves her left arm frequently. Pt received Tramadol for pain upon her last visit 09/17/13. Pt states she does not currently work. Pt also notes that she "hates shots" and requests to not receive a shot today at her visit.     Past Medical History  Diagnosis Date  . Achilles tendonitis 10/2010  . Ankle deformity 10/2010  . Peripheral neuropathy 10/2010    Secondary to Lumbar Radiculopathy  . Lumbar radiculopathy 10/2010  . Chronic leg pain     left  . Chronic back pain   . Chronic neck pain   . Chronic abdominal pain   . IBS (irritable bowel syndrome)      Home Meds: Prior to Admission medications   Medication Sig Start Date End Date Taking? Authorizing Provider  albuterol (PROVENTIL HFA;VENTOLIN HFA) 108 (90 BASE) MCG/ACT inhaler Inhale 2 puffs into the lungs every 6 (six) hours as needed for wheezing or shortness of breath.   Yes Historical Provider, MD  benzonatate (TESSALON) 200 MG capsule Take 1 capsule (200 mg total) by mouth 2 (two) times daily as needed for cough. 11/17/13  Yes [name], MD  hydrochlorothiazide (HYDRODIURIL) 25 MG tablet Take 25 mg by mouth daily as needed (fluid).    Yes Historical Provider, MD  HYDROcodone-acetaminophen (NORCO) 5-325 MG per tablet Take 1 tablet by mouth every 12 (twelve) hours as needed for moderate pain. 11/17/13  Yes [name], MD  methocarbamol (ROBAXIN) 500 MG tablet Take 1 tablet (500 mg total) by mouth every 8 (eight) hours as needed for muscle spasms. 11/17/13  Yes [name], MD    Allergies:  Allergies  Allergen Reactions  . Demerol [Meperidine] Nausea And Vomiting  . Percocet [Oxycodone-Acetaminophen] Nausea And Vomiting  . Penicillins Rash    History   Social History  . Marital Status: Married    Spouse Name: N/A  . Number of Children: N/A  . Years of Education: N/A   Occupational History  . Not on file.   Social History Main Topics  . Smoking status: Never Smoker   . Smokeless tobacco: Not on file  . Alcohol Use: No  . Drug Use: No  . Sexual Activity: Not on file   Other Topics Concern  . Not on file   Social History Narrative     Review of Systems: Constitutional: negative for chills, fever, night sweats, weight changes, or fatigue  HEENT: negative for vision changes, hearing loss, congestion, rhinorrhea, ST, epistaxis, or sinus pressure Cardiovascular: negative for chest pain or palpitations Respiratory: negative for hemoptysis, wheezing, shortness of breath, or cough Abdominal: negative for abdominal pain, nausea, vomiting, diarrhea, or constipation Dermatological: negative for rash  Neurologic: negative for headache, dizziness, or syncope Musculoskeletal: arthralgia in left shoulder, swelling of left upper arm/ underarm, left-sided neck pain All other systems reviewed and are otherwise negative with the exception to those above and in the HPI.   Physical Exam: Blood pressure 120/80, pulse 69, temperature 98.4 F (36.9 C), temperature source Oral, resp. rate 16, height 5\' 6"  (1.676 m), weight 198 lb (89.812 kg), SpO2 95 %., Body mass index is 31.97 kg/(m^2). General: Well developed,  well nourished, in no acute distress. Head: Normocephalic, atraumatic, eyes without discharge, sclera non-icteric, nares are without discharge. Bilateral auditory canals clear, TM's are without perforation, pearly grey and translucent with reflective cone of light bilaterally. Oral cavity moist, posterior pharynx without exudate, erythema, peritonsillar abscess, or post nasal drip.  Neck: Supple. No thyromegaly. Full ROM. No lymphadenopathy. Lungs: Clear bilaterally to auscultation without wheezes, rales, or rhonchi. Breathing is unlabored. Heart: RRR with S1 S2. No murmurs, rubs, or gallops appreciated. Abdomen: Soft, non-tender, non-distended with normoactive bowel sounds. No hepatomegaly. No rebound/guarding. No obvious abdominal masses. Msk:  Strength and tone normal for age. Extremities/Skin: Warm and dry. No clubbing or cyanosis. No edema. No rashes or suspicious lesions. Neuro: Alert and oriented X 3. Moves all extremities spontaneously. Gait is normal. CNII-XII grossly in tact. Psych:  Responds to questions appropriately with a normal affect.   [facility] reading (PRIMARY) by  Dr. Joseph Art:  Left shoulder shows mild degenerative changes.  ASSESSMENT AND PLAN:  64 y.o. year old female with      This chart was scribed in my presence and reviewed by me personally.    ICD-9-CM ICD-10-CM   1. Left shoulder pain 719.41 M25.512 DG Shoulder Left     Ambulatory referral to Physical Therapy     predniSONE (DELTASONE) 20 MG tablet  2. Rotator cuff (capsule) sprain, left, subsequent encounter V58.89 S43.422D predniSONE (DELTASONE) 20 MG tablet   840.4       Signed, [name], MD

## 2014-04-27 ENCOUNTER — Other Ambulatory Visit: Payer: Self-pay | Admitting: Family Medicine

## 2014-04-27 DIAGNOSIS — M79621 Pain in right upper arm: Secondary | ICD-10-CM

## 2014-04-27 DIAGNOSIS — R928 Other abnormal and inconclusive findings on diagnostic imaging of breast: Secondary | ICD-10-CM

## 2014-04-29 ENCOUNTER — Ambulatory Visit
Admission: RE | Admit: 2014-04-29 | Discharge: 2014-04-29 | Disposition: A | Discharge status: Home / Self Care | Payer: BLUE CROSS/BLUE SHIELD | Source: Ambulatory Visit | Attending: Family Medicine | Admitting: Family Medicine

## 2014-04-29 DIAGNOSIS — R928 Other abnormal and inconclusive findings on diagnostic imaging of breast: Secondary | ICD-10-CM

## 2014-04-29 DIAGNOSIS — M79621 Pain in right upper arm: Secondary | ICD-10-CM

## 2014-05-29 ENCOUNTER — Ambulatory Visit (INDEPENDENT_AMBULATORY_CARE_PROVIDER_SITE_OTHER): Payer: BLUE CROSS/BLUE SHIELD | Admitting: Family Medicine

## 2014-05-29 VITALS — BP 130/72 | HR 75 | Temp 98.2°F | Resp 16 | Ht 66.0 in | Wt 197.6 lb

## 2014-05-29 DIAGNOSIS — R109 Unspecified abdominal pain: Secondary | ICD-10-CM

## 2014-05-29 DIAGNOSIS — R112 Nausea with vomiting, unspecified: Secondary | ICD-10-CM | POA: Diagnosis not present

## 2014-05-29 DIAGNOSIS — E860 Dehydration: Secondary | ICD-10-CM | POA: Diagnosis not present

## 2014-05-29 LAB — COMPLETE METABOLIC PANEL WITH GFR
ALT: 20 U/L (ref 0–35)
AST: 28 U/L (ref 0–37)
Albumin: 4 g/dL (ref 3.5–5.2)
Alkaline Phosphatase: 79 U/L (ref 39–117)
BUN: 17 mg/dL (ref 6–23)
CO2: 24 mEq/L (ref 19–32)
Calcium: 9.7 mg/dL (ref 8.4–10.5)
Chloride: 101 mEq/L (ref 96–112)
Creat: 0.87 mg/dL (ref 0.50–1.10)
GFR, Est African American: 83 mL/min
GFR, Est Non African American: 72 mL/min
Glucose, Bld: 119 mg/dL — ABNORMAL HIGH (ref 70–99)
Potassium: 5 mEq/L (ref 3.5–5.3)
Sodium: 136 mEq/L (ref 135–145)
Total Bilirubin: 0.4 mg/dL (ref 0.2–1.2)
Total Protein: 7.9 g/dL (ref 6.0–8.3)

## 2014-05-29 LAB — POCT URINALYSIS DIPSTICK
Bilirubin, UA: NEGATIVE
Blood, UA: NEGATIVE
Glucose, UA: NEGATIVE
KETONES UA: NEGATIVE
Leukocytes, UA: NEGATIVE
Nitrite, UA: NEGATIVE
PH UA: 7
Spec Grav, UA: 1.02
Urobilinogen, UA: 0.2

## 2014-05-29 LAB — POCT CBC
Granulocyte percent: 80.6 %G — AB (ref 37–80)
HCT, POC: 42.4 % (ref 37.7–47.9)
Hemoglobin: 13.7 g/dL (ref 12.2–16.2)
Lymph, poc: 2 (ref 0.6–3.4)
MCH, POC: 26.1 pg — AB (ref 27–31.2)
MCHC: 32.4 g/dL (ref 31.8–35.4)
MCV: 80.7 fL (ref 80–97)
MID (cbc): 0.3 (ref 0–0.9)
MPV: 7.6 fL (ref 0–99.8)
POC Granulocyte: 9.7 — AB (ref 2–6.9)
POC LYMPH PERCENT: 16.9 %L (ref 10–50)
POC MID %: 2.5 %M (ref 0–12)
Platelet Count, POC: 277 10*3/uL (ref 142–424)
RBC: 5.25 M/uL (ref 4.04–5.48)
RDW, POC: 13.8 %
WBC: 12 10*3/uL — AB (ref 4.6–10.2)

## 2014-05-29 LAB — POCT UA - MICROSCOPIC ONLY
CASTS, UR, LPF, POC: NEGATIVE
Crystals, Ur, HPF, POC: NEGATIVE
YEAST UA: NEGATIVE

## 2014-05-29 MED ORDER — PROMETHAZINE HCL 12.5 MG PO TABS: 12.5000 mg | ORAL_TABLET | Freq: Three times a day (TID) | ORAL | Status: DC | PRN

## 2014-05-29 MED ORDER — ONDANSETRON HCL 4 MG/2ML IJ SOLN
2.0000 mg | Freq: Once | INTRAMUSCULAR | Status: AC
  Administered 2014-05-29: 2 mg via INTRAVENOUS

## 2014-05-29 NOTE — Patient Instructions (Signed)

## 2014-05-29 NOTE — Progress Notes (Addendum)
Subjective:  This chart was scribed for Kelly Haber, MD by Leandra Kern, Medical Scribe. This patient was seen in Room 5 and the patient's care was started at 1:14 PM.   Patient ID: Kelly Rosales, female    DOB: 11-Aug-1950, 64 y.o.   MRN: [id number]  HPI HPI Comments: Kelly Rosales is a 64 y.o. female with a PMHx of chronic abdominal pain, IBS, and HLD, who presents to [facility] complaining of emesis and flank pain, gradual onset for ten days.  Patient reports that she had the pain with emesis for 4 days, then the following 5 days she did not have any vomiting episodes, however feeling weak. Vomiting episodes came back this morning accompanied with flank pain on the left side of the abdomen. Her emesis is coffee-ground color. She reports associated symptoms of abdominal cramps, chills, dizziness, dry mouth with sour taste, headaches on the front side of the head, and fevers. Patient denies diarrhea, hematemesis.    Review of Systems  Constitutional: Positive for fever and chills.  Gastrointestinal: Positive for vomiting and abdominal pain. Negative for diarrhea.  Genitourinary: Positive for flank pain.  Neurological: Positive for dizziness, weakness and headaches.      Objective:   Physical Exam  Constitutional: She is oriented to person, place, and time. She appears well-developed and well-nourished. No distress.  HENT:  Head: Normocephalic and atraumatic.  Mouth/Throat: Oropharynx is clear and moist. No oropharyngeal exudate.  Eyes: Pupils are equal, round, and reactive to light.  Neck: Neck supple.  Cardiovascular: Normal rate.   Pulmonary/Chest: Effort normal.  Musculoskeletal: She exhibits no edema.  Neurological: She is alert and oriented to person, place, and time. No cranial nerve deficit.  Skin: Skin is warm and dry. No rash noted.  Psychiatric: She has a normal mood and affect. Her behavior is normal.  Nursing note and vitals  reviewed.  there is no localized abdominal pain with palpation and patient has no guarding or rebound Results for orders placed or performed in visit on 05/29/14  POCT urinalysis dipstick  Result Value Ref Range   Color, UA yellow    Clarity, UA clear    Glucose, UA neg    Bilirubin, UA neg    Ketones, UA neg    Spec Grav, UA 1.020    Blood, UA neg    pH, UA 7.0    Protein, UA trace    Urobilinogen, UA 0.2    Nitrite, UA neg    Leukocytes, UA Negative   POCT UA - Microscopic Only  Result Value Ref Range   WBC, Ur, HPF, POC 0-1    RBC, urine, microscopic 0-2    Bacteria, U Microscopic 1+    Mucus, UA trace    Epithelial cells, urine per micros 0-2    Crystals, Ur, HPF, POC neg    Casts, Ur, LPF, POC neg    Yeast, UA neg   POCT CBC  Result Value Ref Range   WBC 12.0 (A) 4.6 - 10.2 K/uL   Lymph, poc 2.0 0.6 - 3.4   POC LYMPH PERCENT 16.9 10 - 50 %L   MID (cbc) 0.3 0 - 0.9   POC MID % 2.5 0 - 12 %M   POC Granulocyte 9.7 (A) 2 - 6.9   Granulocyte percent 80.6 (A) 37 - 80 %G   RBC 5.25 4.04 - 5.48 M/uL   Hemoglobin 13.7 12.2 - 16.2 g/dL   HCT, POC 42.4 37.7 -  47.9 %   MCV 80.7 80 - 97 fL   MCH, POC 26.1 (A) 27 - 31.2 pg   MCHC 32.4 31.8 - 35.4 g/dL   RDW, POC 13.8 %   Platelet Count, POC 277 142 - 424 K/uL   MPV 7.6 0 - 99.8 fL    Patient observed in office intermittently for over 3 hours     Assessment & Plan:    This chart was scribed in my presence and reviewed by me personally.    ICD-9-CM ICD-10-CM   1. Flank pain 789.09 R10.9 POCT urinalysis dipstick     POCT UA - Microscopic Only     POCT CBC     COMPLETE METABOLIC PANEL WITH GFR  2. Nausea and vomiting, vomiting of unspecified type 787.01 R11.2 POCT CBC     COMPLETE METABOLIC PANEL WITH GFR  3. Dehydration 276.51 E86.0    No evidence for UTI or kidney stone. There is also no evidence for an acute GI bleed on the CBC but she has had coffee-ground material in the emesis.  Patient most likely has acute  gastroenteritis, but she has not improved after 2 L of IV fluids and so I think it's important that she get further evaluation in the emergency room. Her husband agrees to take her to [place] [facility].  Signed, Kelly Haber, MD  06/01/2014:  I received a call from Dr. Joaquin Music letting me know that patient was upset that I had "passed her off onto someone else."  She asked me to call patient.  The patient was in her office, and I agreed to call patient at 11:30am.  I have called twice.  The answering machine demands a caller "access code." Kelly Rosales

## 2014-05-30 LAB — URINE CULTURE: Colony Count: 25000

## 2014-06-01 ENCOUNTER — Encounter: Payer: Self-pay | Admitting: Radiology

## 2014-06-01 ENCOUNTER — Telehealth: Payer: Self-pay

## 2014-06-01 DIAGNOSIS — R112 Nausea with vomiting, unspecified: Secondary | ICD-10-CM

## 2014-06-01 DIAGNOSIS — R109 Unspecified abdominal pain: Secondary | ICD-10-CM

## 2014-06-01 NOTE — Telephone Encounter (Signed)
Unable to get through answering machine which demands and access code.  Please send letter to patient explaining that she needs some form of open communication for me to help her.

## 2014-06-01 NOTE — Telephone Encounter (Signed)
Will send letter.

## 2014-06-01 NOTE — Telephone Encounter (Signed)
Dr.Grandon gary called in regards to patient, she states that it is very important for Dr.L to get in touch with this patient, she is not doing any better. Pt was seen 05/29/2014 for flank pain, Nausea and vomiting, vomiting of unspecified type, and Dehydration. Dr. Dominica Severin can be reached at [phone] and patient can be reached at [phone]. Please advise

## 2014-06-01 NOTE — Telephone Encounter (Signed)
Patient was instructed to go to [facility] emergency room on Saturday after we gave her 2 liters of fluid and did everything we could think of to help her.  I told her further testing was important and she agreed to go.  There is no documentation that she did this. I will call her

## 2014-06-02 ENCOUNTER — Ambulatory Visit (INDEPENDENT_AMBULATORY_CARE_PROVIDER_SITE_OTHER): Payer: BLUE CROSS/BLUE SHIELD | Admitting: Family Medicine

## 2014-06-02 ENCOUNTER — Encounter: Payer: Self-pay | Admitting: Family Medicine

## 2014-06-02 ENCOUNTER — Other Ambulatory Visit: Payer: Self-pay | Admitting: Family Medicine

## 2014-06-02 VITALS — BP 156/88 | HR 69 | Temp 98.5°F | Resp 18 | Ht 65.0 in | Wt 195.0 lb

## 2014-06-02 DIAGNOSIS — R109 Unspecified abdominal pain: Secondary | ICD-10-CM | POA: Diagnosis not present

## 2014-06-02 DIAGNOSIS — R1013 Epigastric pain: Secondary | ICD-10-CM | POA: Diagnosis not present

## 2014-06-02 NOTE — Progress Notes (Signed)
° °  Subjective:    Patient ID: Kelly Rosales, female    DOB: [birth year], 64 y.o.   MRN: [id number]  This chart was scribed for [name], MD, by Stephania Fragmin, ED Scribe. This patient was seen in room 4 and the patient's care was started at 1:33 PM.   HPI  Chief Complaint  Patient presents with   Follow-up    Left flank pain    HPI Comments: Kelly Rosales is a 64 y.o. female with a PMHx of chronic abdominal pain, IBS, and HLD, who presents to the [facility] for left sided abdominal pain, which she was evaluated for by me 4 days ago. She also complains of associated soreness in her bilateral arms. She did have soreness in her legs, but these have mostly resolved. Her vomiting has also resolved since her last visit - at that time, she had been having constant coffee ground emesis, even when she wasn't eating much.   No emesis in 2 days   Patient did not go to the ED as I had advised her to following her last visit, as she states she did not want to have to wait 5-6 hours. She denies any respiratory symptoms. Patient has never had a colonoscopy. She has not taken Prilosec before.   Alternate phone number: [phone]  Review of Systems  Gastrointestinal: Positive for abdominal pain. Negative for vomiting.  Musculoskeletal: Positive for myalgias.      Objective:   Physical Exam  Constitutional: She is oriented to person, place, and time. She appears well-developed and well-nourished. No distress.  HENT:  Head: Normocephalic and atraumatic.  Eyes: Conjunctivae and EOM are normal.  Neck: Neck supple. No tracheal deviation present.  Cardiovascular: Normal rate.   Pulmonary/Chest: Effort normal. No respiratory distress.  Abdominal: She exhibits no mass. There is no hepatosplenomegaly. There is tenderness. There is no rebound and no guarding.  Tender in epigastrium and left abdomen. No masses. No HSM. No guarding or rebound.   Musculoskeletal: Normal  range of motion.  Neurological: She is alert and oriented to person, place, and time.  Skin: Skin is warm and dry.  Psychiatric: She has a normal mood and affect. Her behavior is normal.  Nursing note and vitals reviewed.    Assessment & Plan:   This chart was scribed in my presence and reviewed by me personally.    ICD-9-CM ICD-10-CM   1. Abdominal pain, epigastric 789.06 R10.13 CT Abdomen Pelvis W Contrast   Nonspecific abdominal pain. After the CAT scan, if nothing is revealed, GI consult  Signed, [name], MD

## 2014-06-02 NOTE — Patient Instructions (Addendum)
[facility] could not do the CAT scan until tomorrow morning. Your scan will be at [facility] at 8:10 am 06/03/14. Drink the first bottle of contrast at 6:30, second bottle at 7:30 am. NO solid foods 4 hours prior.

## 2014-06-02 NOTE — Telephone Encounter (Signed)
I called pt this morning to check on her per Dr. Joseph Art. Pt was very rude to me when i was just trying to see how I could help her. She wanted me to find out who was helping her on Saturday stating she was not "helpful" at all and not concerned that she was throwing up blood. I could not figure out who she was referring to. I advised her that Dr. Joseph Art wanted her to go to the ER that day but pt states she did not want to sit in the emergency room all day so she did not go. I felt like I could not help pt and did not want to her to be more angry  towards me so I politely ended the call. Pt called back an hour later asking to speak to Dr. Joseph Art, I advised her again that he is seeing pattients this morning and he will return her call when he is able. Pt understood.

## 2014-06-03 ENCOUNTER — Ambulatory Visit
Admission: RE | Admit: 2014-06-03 | Discharge: 2014-06-03 | Disposition: A | Discharge status: Home / Self Care | Payer: BLUE CROSS/BLUE SHIELD | Source: Ambulatory Visit | Attending: Family Medicine | Admitting: Family Medicine

## 2014-06-03 ENCOUNTER — Other Ambulatory Visit: Payer: Self-pay | Admitting: Family Medicine

## 2014-06-03 DIAGNOSIS — N3289 Other specified disorders of bladder: Secondary | ICD-10-CM

## 2014-06-03 DIAGNOSIS — R1013 Epigastric pain: Secondary | ICD-10-CM

## 2014-06-03 MED ORDER — IOHEXOL 300 MG/ML  SOLN
100.0000 mL | Freq: Once | INTRAMUSCULAR | Status: AC | PRN
  Administered 2014-06-03: 100 mL via INTRAVENOUS

## 2014-06-09 ENCOUNTER — Telehealth: Payer: Self-pay

## 2014-06-09 NOTE — Telephone Encounter (Signed)
Almyra Free brought this call to my attention and asked me to try to call the pt back again since we have had such a difficult time reaching her, and pt CB today looking for someone to go over her CT results. I tried to call pt and again, there was no answer after ringing many times and no VM set up to LM. I will try again before I leave.  I reached pt and went over her results. Pt reported that she saw the GI doctor today and she has another test (scope) sch for tomorrow to see why she is vomiting everything she ingests. I looked up her referral info to urologist since she is difficult to reach and gave her the appt time, address/ph #. Then pt reported that she had a sharp pain in her chest and in her left arm yesterday and then again this afternoon. She stated she just had to sit down and rest and pain resolved. I advised her that she needs to have this evaluated to make sure it is not a heart issue. Advised that she would be best to just go to the ER because they can run the tests they will want to check right away. Pt agreed to go to ER.  Tim, this is the pt I spoke to you about that we could not reach w/results until today.

## 2014-06-09 NOTE — Telephone Encounter (Signed)
Pt is needing to talk with someone about a referral we made for her and why because she still has not received results of her scan yet

## 2014-06-09 NOTE — Telephone Encounter (Signed)
Kelly Rosales tried to call pt on 5/27. No answer no voicemail to leave message. I called pt today, no answer, no voicemail.   Notes Recorded by [name], MD on 06/03/2014 at 3:50 PM Please inform patient of normal result. There is quite a bit of stool in the latter part of the colon which is on the left side. No cancer seen. Recommend taking miralax bid for 5 days. Also, there is some chronic inflammatory change in the bladder. For this, I will get a urologist to consult.

## 2014-07-12 ENCOUNTER — Emergency Department (HOSPITAL_COMMUNITY)
Admission: EM | Admit: 2014-07-12 | Discharge: 2014-07-12 | Disposition: A | Discharge status: Home / Self Care | Payer: BLUE CROSS/BLUE SHIELD | Attending: Emergency Medicine | Admitting: Emergency Medicine

## 2014-07-12 ENCOUNTER — Encounter (HOSPITAL_COMMUNITY): Payer: Self-pay | Admitting: Emergency Medicine

## 2014-07-12 DIAGNOSIS — Z79891 Long term (current) use of opiate analgesic: Secondary | ICD-10-CM | POA: Insufficient documentation

## 2014-07-12 DIAGNOSIS — R109 Unspecified abdominal pain: Secondary | ICD-10-CM

## 2014-07-12 DIAGNOSIS — R1032 Left lower quadrant pain: Secondary | ICD-10-CM

## 2014-07-12 DIAGNOSIS — Z8739 Personal history of other diseases of the musculoskeletal system and connective tissue: Secondary | ICD-10-CM | POA: Insufficient documentation

## 2014-07-12 DIAGNOSIS — R112 Nausea with vomiting, unspecified: Secondary | ICD-10-CM | POA: Diagnosis not present

## 2014-07-12 DIAGNOSIS — Z79899 Other long term (current) drug therapy: Secondary | ICD-10-CM | POA: Insufficient documentation

## 2014-07-12 DIAGNOSIS — Z8719 Personal history of other diseases of the digestive system: Secondary | ICD-10-CM | POA: Diagnosis not present

## 2014-07-12 DIAGNOSIS — G8929 Other chronic pain: Secondary | ICD-10-CM | POA: Insufficient documentation

## 2014-07-12 DIAGNOSIS — E669 Obesity, unspecified: Secondary | ICD-10-CM | POA: Diagnosis not present

## 2014-07-12 LAB — COMPREHENSIVE METABOLIC PANEL
ALBUMIN: 3.7 g/dL (ref 3.5–5.0)
ALT: 17 U/L (ref 14–54)
AST: 22 U/L (ref 15–41)
Alkaline Phosphatase: 76 U/L (ref 38–126)
Anion gap: 9 (ref 5–15)
BILIRUBIN TOTAL: 0.4 mg/dL (ref 0.3–1.2)
BUN: 10 mg/dL (ref 6–20)
CO2: 25 mmol/L (ref 22–32)
Calcium: 9.1 mg/dL (ref 8.9–10.3)
Chloride: 104 mmol/L (ref 101–111)
Creatinine, Ser: 0.9 mg/dL (ref 0.44–1.00)
GFR calc Af Amer: >60 mL/min (ref >60)
GFR calc non Af Amer: >60 mL/min (ref >60)
GLUCOSE: 121 mg/dL — AB (ref 65–99)
Potassium: 3.6 mmol/L (ref 3.5–5.1)
SODIUM: 138 mmol/L (ref 135–145)
Total Protein: 7.9 g/dL (ref 6.5–8.1)

## 2014-07-12 LAB — URINALYSIS, ROUTINE W REFLEX MICROSCOPIC
BILIRUBIN URINE: NEGATIVE
Glucose, UA: NEGATIVE mg/dL
HGB URINE DIPSTICK: NEGATIVE
Ketones, ur: 15 mg/dL — AB
Leukocytes, UA: NEGATIVE
NITRITE: NEGATIVE
PH: 7 (ref 5.0–8.0)
Protein, ur: NEGATIVE mg/dL
Specific Gravity, Urine: 1.018 (ref 1.005–1.030)
UROBILINOGEN UA: 1 mg/dL (ref 0.0–1.0)

## 2014-07-12 LAB — CBC WITH DIFFERENTIAL/PLATELET
Basophils Absolute: 0 10*3/uL (ref 0.0–0.1)
Basophils Relative: 0 % (ref 0–1)
Eosinophils Absolute: 0 10*3/uL (ref 0.0–0.7)
Eosinophils Relative: 0 % (ref 0–5)
HEMATOCRIT: 40.9 % (ref 36.0–46.0)
Hemoglobin: 13.4 g/dL (ref 12.0–15.0)
LYMPHS ABS: 1.5 10*3/uL (ref 0.7–4.0)
LYMPHS PCT: 14 % (ref 12–46)
MCH: 26.7 pg (ref 26.0–34.0)
MCHC: 32.8 g/dL (ref 30.0–36.0)
MCV: 81.6 fL (ref 78.0–100.0)
MONOS PCT: 4 % (ref 3–12)
Monocytes Absolute: 0.4 10*3/uL (ref 0.1–1.0)
NEUTROS ABS: 8.8 10*3/uL — AB (ref 1.7–7.7)
Neutrophils Relative %: 82 % — ABNORMAL HIGH (ref 43–77)
Platelets: 325 10*3/uL (ref 150–400)
RBC: 5.01 MIL/uL (ref 3.87–5.11)
RDW: 13.5 % (ref 11.5–15.5)
WBC: 10.7 10*3/uL — ABNORMAL HIGH (ref 4.0–10.5)

## 2014-07-12 LAB — LIPASE, BLOOD: LIPASE: 29 U/L (ref 22–51)

## 2014-07-12 MED ORDER — ACETAMINOPHEN 325 MG PO TABS
650.0000 mg | ORAL_TABLET | Freq: Once | ORAL | Status: DC
  Filled 2014-07-12: qty 2

## 2014-07-12 MED ORDER — RANITIDINE HCL 150 MG PO TABS: 150.0000 mg | ORAL_TABLET | Freq: Two times a day (BID) | ORAL | Status: DC

## 2014-07-12 MED ORDER — SODIUM CHLORIDE 0.9 % IV BOLUS (SEPSIS)
1000.0000 mL | Freq: Once | INTRAVENOUS | Status: AC
  Administered 2014-07-12: 1000 mL via INTRAVENOUS

## 2014-07-12 MED ORDER — ONDANSETRON HCL 4 MG/2ML IJ SOLN
4.0000 mg | Freq: Once | INTRAMUSCULAR | Status: AC
  Administered 2014-07-12: 4 mg via INTRAVENOUS
  Filled 2014-07-12: qty 2

## 2014-07-12 NOTE — ED Notes (Signed)
Pt requesting pain medication, MD to be notified.

## 2014-07-12 NOTE — ED Provider Notes (Signed)
CSN: [id number]     Arrival date & time 07/12/14  1956 History   First MD Initiated Contact with Patient 07/12/14 2034     Chief Complaint  Patient presents with  . Abdominal Pain  . Emesis   (Consider location/radiation/quality/duration/timing/severity/associated sxs/prior Treatment) Patient is a 64 y.o. female presenting with abdominal pain. The history is provided by the patient.  Abdominal Pain Pain location:  LLQ Pain quality: aching and cramping   Pain radiates to:  Does not radiate Pain severity:  Moderate Onset quality:  Gradual Timing:  Intermittent Progression:  Waxing and waning Chronicity:  Recurrent Context: retching   Context: not eating, not recent travel, not sick contacts and not trauma   Relieved by:  Nothing Worsened by:  Vomiting Ineffective treatments:  None tried Associated symptoms: belching, nausea and vomiting   Associated symptoms: no anorexia, no chest pain, no chills, no constipation, no cough, no diarrhea, no dysuria, no fatigue, no fever, no melena and no shortness of breath   Risk factors: obesity     Past Medical History  Diagnosis Date  . Achilles tendonitis 10/2010  . Ankle deformity 10/2010  . Peripheral neuropathy 10/2010    Secondary to Lumbar Radiculopathy  . Lumbar radiculopathy 10/2010  . Chronic leg pain     left  . Chronic back pain   . Chronic neck pain   . Chronic abdominal pain   . IBS (irritable bowel syndrome)    Past Surgical History  Procedure Laterality Date  . Austin bunionectomy Left 2003  . Cotton osteotomy w/ graft Left 04/10/12  . Tal lengthening Left 04/10/2012  . Nail matrixectomy Left 04/10/12  . Foot surgery     Family History  Problem Relation Age of Onset  . Cancer Mother   . Diabetes Mother   . Heart disease Mother   . Hyperlipidemia Mother   . Hypertension Mother    History  Substance Use Topics  . Smoking status: Never Smoker   . Smokeless tobacco: Not on file  . Alcohol Use: No   OB History    No data available     Review of Systems  Constitutional: Negative for fever, chills and fatigue.  Respiratory: Negative for cough, chest tightness and shortness of breath.   Cardiovascular: Negative for chest pain.  Gastrointestinal: Positive for nausea, vomiting and abdominal pain. Negative for diarrhea, constipation, blood in stool, melena and anorexia.  Genitourinary: Negative for dysuria.  Neurological: Negative for weakness, light-headedness and headaches.  Psychiatric/Behavioral: Negative for confusion.  All other systems reviewed and are negative.     Allergies  Demerol; Percocet; and Penicillins  Home Medications   Prior to Admission medications   Medication Sig Start Date End Date Taking? Authorizing Provider  albuterol (PROVENTIL HFA;VENTOLIN HFA) 108 (90 BASE) MCG/ACT inhaler Inhale 2 puffs into the lungs every 6 (six) hours as needed for wheezing or shortness of breath.   Yes Historical Provider, MD  lisinopril-hydrochlorothiazide (PRINZIDE,ZESTORETIC) 20-25 MG per tablet Take 1 tablet by mouth daily. 06/10/14  Yes Historical Provider, MD  oxyCODONE (ROXICODONE) 15 MG immediate release tablet Take 15 mg by mouth 3 (three) times daily. 06/10/14  Yes Historical Provider, MD  ranitidine (ZANTAC) 150 MG tablet Take 1 tablet (150 mg total) by mouth 2 (two) times daily. 07/12/14   Tori Milks, MD   BP 140/70 mmHg  Pulse 58  Temp(Src) 98.2 F (36.8 C) (Oral)  Resp 12  Ht 5\' 7"  (1.702 m)  Wt 193 lb 2 oz (87.601  kg)  BMI 30.24 kg/m2  SpO2 95% Physical Exam  Constitutional: She appears well-developed and well-nourished. No distress.  Looks clinically well hydrated  HENT:  Head: Normocephalic and atraumatic.  Nose: Nose normal.  Mouth/Throat: Oropharynx is clear and moist. No oropharyngeal exudate.  Moist mucosal membranes  Eyes: EOM are normal. Pupils are equal, round, and reactive to light.  Neck: Normal range of motion. Neck supple.  Cardiovascular: Normal rate, regular  rhythm, normal heart sounds and intact distal pulses.   No murmur heard. Pulmonary/Chest: Effort normal and breath sounds normal. No respiratory distress. She has no wheezes. She exhibits no tenderness.  Abdominal: Soft. There is no tenderness. There is no rebound and no guarding.  Soft, nondistended, nontender to palpation diffusely, tolerates shaking the bed and has no evidence of peritonitis.    Musculoskeletal: Normal range of motion. She exhibits no tenderness.  Lymphadenopathy:    She has no cervical adenopathy.  Neurological: She is alert. No cranial nerve deficit. Coordination normal.  Skin: Skin is warm and dry. She is not diaphoretic.  Psychiatric: She has a normal mood and affect. Her behavior is normal. Judgment and thought content normal.  Nursing note and vitals reviewed.   ED Course  Procedures (including critical care time) Labs Review Labs Reviewed  CBC WITH DIFFERENTIAL/PLATELET - Abnormal; Notable for the following:    WBC 10.7 (*)    Neutrophils Relative % 82 (*)    Neutro Abs 8.8 (*)    All other components within normal limits  COMPREHENSIVE METABOLIC PANEL - Abnormal; Notable for the following:    Glucose, Bld 121 (*)    All other components within normal limits  URINALYSIS, ROUTINE W REFLEX MICROSCOPIC (NOT AT [facility]) - Abnormal; Notable for the following:    Ketones, ur 15 (*)    All other components within normal limits  LIPASE, BLOOD    Imaging Review No results found.   EKG Interpretation None      MDM   Final diagnoses:  LLQ abdominal pain  Chronic abdominal pain  Non-intractable vomiting with nausea, vomiting of unspecified type   Pt is a 64 yo F with hx of IBS who presents with recurrent cronic LLQ abd pain, on and off for the past few months.  Sx have flared up for the past 3-4 days, with associated nausea and several episodes of NBNB emesis today.  No diarrhea, no blood pr mucous in stool.  No recent travel, no known sick contacts, no  suspicious food intake.   Has been recently worked up with a CT abd/pelvis and an EGD for symptoms that she reports are the exact same as todays.  CT abd/pelvis w/ contrast on 06/03/14: no acute pathology, normal appendix, redundant sigmoid colon with no obstruction, uterine fibroid.    Nontoxic appearing, vitals stable.  Does not look clinically dehydrated.  No tenderness to palpation of abdomen, no guarding, no peritoneal signs.   Given zofran and NS bolus.  Abdominal labs sent to evaluate further.    Labs benign today.   Given tylenol for mild headache.  Doubt acute emergent abdominal pathology in this patient.  Do not believe she needs additional imaging at this time for the same pain as a previously benign CT scan.  Advised that she will likely benefit from a colonoscopy and this should be scheduled with her GI clinic.  Encouraged smart diet choices and will discharge with rx for zantac to see if it helps decrease the frequency of her sx.  All questions were answered and ED return precautions discussed in length.  Discharged in good condition.     If performed, labs, EKGs, and imaging were reviewed and interpreted by myself and my attending, and incorporated in the medical decision making.  Patient was seen with ED Attending, Dr. Dorma Russell, MD       Tori Milks, MD 07/14/14 0500  Blanchie Dessert, MD 07/15/14 [phone]

## 2014-07-12 NOTE — ED Notes (Signed)
Pt. reports left abdominal pain with emesis onset this morning , denies diarrhea , no fever or chills.

## 2014-08-03 ENCOUNTER — Encounter: Payer: Self-pay | Admitting: Internal Medicine

## 2014-08-03 ENCOUNTER — Telehealth: Payer: Self-pay

## 2014-08-03 NOTE — Telephone Encounter (Signed)
Pt walked in to leave Dr. Joseph Art a message. She states she is not better. She is throwing up everything and feels miserable. I advised her to make an appointment with Dr. Collene Mares again but pt wanted to see Dr. Joseph Art first. Please advise. I know you are not here for a while and I told pt also.  [phone]

## 2014-08-04 NOTE — Telephone Encounter (Signed)
Called pt, no voicemail

## 2014-08-04 NOTE — Telephone Encounter (Signed)
Advised pt of message, she wants to wait until Dr. Carlean Jews comes back.

## 2014-08-04 NOTE — Telephone Encounter (Signed)
I can see patient today until 18:00 or tomorrow after 12:00. Please let her know. Thank you!

## 2014-08-04 NOTE — Telephone Encounter (Signed)
I am very sorry that I am out of town for [employer].  I would like patient to see Rosario Adie who is monitoring things while I am away and we will discuss the next step based on what he finds.

## 2014-08-09 ENCOUNTER — Other Ambulatory Visit: Payer: Self-pay | Admitting: Gastroenterology

## 2014-08-09 DIAGNOSIS — R110 Nausea: Secondary | ICD-10-CM

## 2014-08-16 ENCOUNTER — Ambulatory Visit (HOSPITAL_COMMUNITY)
Admission: RE | Admit: 2014-08-16 | Discharge: 2014-08-16 | Disposition: A | Discharge status: Home / Self Care | Payer: Medicaid Other | Source: Ambulatory Visit | Attending: Gastroenterology | Admitting: Gastroenterology

## 2014-08-16 DIAGNOSIS — R110 Nausea: Secondary | ICD-10-CM | POA: Diagnosis present

## 2014-08-16 MED ORDER — TECHNETIUM TC 99M SULFUR COLLOID
2.0000 | Freq: Once | INTRAVENOUS | Status: AC | PRN
  Administered 2014-08-16: 2 via ORAL

## 2014-09-18 ENCOUNTER — Ambulatory Visit (INDEPENDENT_AMBULATORY_CARE_PROVIDER_SITE_OTHER): Payer: Self-pay | Admitting: Family Medicine

## 2014-09-18 VITALS — BP 144/78 | HR 71 | Temp 97.8°F | Resp 18 | Ht 66.0 in | Wt 194.0 lb

## 2014-09-18 DIAGNOSIS — R1010 Upper abdominal pain, unspecified: Secondary | ICD-10-CM

## 2014-09-18 DIAGNOSIS — R109 Unspecified abdominal pain: Principal | ICD-10-CM

## 2014-09-18 DIAGNOSIS — G8929 Other chronic pain: Secondary | ICD-10-CM

## 2014-09-18 DIAGNOSIS — D259 Leiomyoma of uterus, unspecified: Secondary | ICD-10-CM | POA: Insufficient documentation

## 2014-09-18 LAB — POCT URINALYSIS DIPSTICK
Bilirubin, UA: NEGATIVE
Blood, UA: NEGATIVE
Glucose, UA: NEGATIVE
Ketones, UA: NEGATIVE
Leukocytes, UA: NEGATIVE
Nitrite, UA: NEGATIVE
Protein, UA: NEGATIVE
Spec Grav, UA: 1.025
Urobilinogen, UA: 1
pH, UA: 6

## 2014-09-18 MED ORDER — DICYCLOMINE HCL 10 MG PO CAPS: 10.0000 mg | ORAL_CAPSULE | Freq: Three times a day (TID) | ORAL | Status: DC | PRN

## 2014-09-18 NOTE — Patient Instructions (Addendum)
I'm referring you to the specialists to further investigate the persistent left flank symptoms and sore muscles. The urine test is negative and the physical exam does not reveal any sign of cancer or acute infection.

## 2014-09-18 NOTE — Progress Notes (Addendum)
@UMFCLOGO @  This chart was scribed for [name], MD by Thea Alken, ED Scribe. This patient was seen in room 3 and the patient's care was started at 1:41 PM.  Patient ID: Kelly Rosales MRN: [id number], DOB: Jul 14, 1950, 64 y.o. Date of Encounter: 09/18/2014, 2:41 PM  Primary Physician: Tyrone Schimke, MD  Chief Complaint:  Chief Complaint  Patient presents with   Arm Pain    soreness continues and knots under skin    Leg Pain   lab results    discuss labs from 5/25   GI Problem    knots under skin   HPI: 64 y.o. year old female with history below presents with ongoing, intermittent left flank pain for the past year. Pt states she goes to bed and wakes up with flank pain. She had CT abdomen 06/03/14. She reports new loss of bladder control 2 days ago. Pt states she felt that she needed to urinate but did not make it to the bathroom in time. She also has had multiple episodes of vomit. She is still having constant arm soreness and feels as if there are knots under her skin.    Past Medical History  Diagnosis Date   Achilles tendonitis 10/2010   Ankle deformity 10/2010   Peripheral neuropathy 10/2010    Secondary to Lumbar Radiculopathy   Lumbar radiculopathy 10/2010   Chronic leg pain     left   Chronic back pain    Chronic neck pain    Chronic abdominal pain    IBS (irritable bowel syndrome)      Home Meds: Prior to Admission medications   Medication Sig Start Date End Date Taking? Authorizing Provider  albuterol (PROVENTIL HFA;VENTOLIN HFA) 108 (90 BASE) MCG/ACT inhaler Inhale 2 puffs into the lungs every 6 (six) hours as needed for wheezing or shortness of breath.   Yes Historical Provider, MD  lisinopril-hydrochlorothiazide (PRINZIDE,ZESTORETIC) 20-25 MG per tablet Take 1 tablet by mouth daily. 06/10/14  Yes Historical Provider, MD  oxyCODONE (ROXICODONE) 15 MG immediate release tablet Take 15 mg by mouth 3 (three) times daily. 06/10/14  Yes Historical  Provider, MD  ranitidine (ZANTAC) 150 MG tablet Take 1 tablet (150 mg total) by mouth 2 (two) times daily. 07/12/14  Yes Tori Milks, MD    Allergies:  Allergies  Allergen Reactions   Demerol [Meperidine] Nausea And Vomiting   Morphine And Related Nausea And Vomiting   Penicillins Rash    Social History   Social History   Marital Status: Married    Spouse Name: N/A   Number of Children: N/A   Years of Education: N/A   Occupational History   Not on file.   Social History Main Topics   Smoking status: Never Smoker    Smokeless tobacco: Not on file   Alcohol Use: No   Drug Use: No   Sexual Activity: Not on file   Other Topics Concern   Not on file   Social History Narrative     Review of Systems: Constitutional: negative for chills, fever, night sweats, weight changes, or fatigue  HEENT: negative for vision changes, hearing loss, congestion, rhinorrhea, ST, epistaxis, or sinus pressure Cardiovascular: negative for chest pain or palpitations Respiratory: negative for hemoptysis, wheezing, shortness of breath, or cough Abdominal: negative for abdominal pain, nausea, vomiting, diarrhea, or constipation Dermatological: negative for rash Neurologic: negative for headache, dizziness, or syncope All other systems reviewed and are otherwise negative with the exception to those above and in the HPI.  Physical Exam: Blood pressure 144/78, pulse 71, temperature 97.8 F (36.6 C), temperature source Oral, resp. rate 18, height 5\' 6"  (1.676 m), weight 194 lb (87.998 kg), SpO2 97 %., Body mass index is 31.33 kg/(m^2). General: Well developed, well nourished, in no acute distress. Head: Normocephalic, atraumatic, eyes without discharge, sclera non-icteric, nares are without discharge. Bilateral auditory canals clear, TM's are without perforation, pearly grey and translucent with reflective cone of light bilaterally. Oral cavity moist, posterior pharynx without exudate,  erythema, peritonsillar abscess, or post nasal drip.  Neck: Supple. No thyromegaly. Full ROM. No lymphadenopathy. Lungs: Clear bilaterally to auscultation without wheezes, rales, or rhonchi. Breathing is unlabored. Heart: RRR with S1 S2. No murmurs, rubs, or gallops appreciated. Abdomen: Soft, non-tender, non-distended with normoactive bowel sounds. No hepatomegaly. No rebound/guarding. No obvious abdominal masses. Msk:  Strength and tone normal for age. Extremities/Skin: Warm and dry. No clubbing or cyanosis. No edema. No rashes or suspicious lesions. Neuro: Alert and oriented X 3. Moves all extremities spontaneously. Gait is normal. CNII-XII grossly in tact. Psych:  Responds to questions appropriately with a normal affect.   Labs: CT Abdomen 05/2014 IMPRESSION: 1. No acute inflammatory process within abdomen. 2. Somewhat high position of the cecum. No pericecal inflammation. Normal retrocecal appendix. 3. Markedly redundant sigmoid colon as described above. Moderate stool is noted within sigmoid colon. Moderate stool within rectum. There is no evidence of distal colonic obstruction. 4. Mild degenerative changes lumbar spine. 5. No hydronephrosis or hydroureter. Stable right renal cysts. 6. There is mild thickening of urinary bladder wall. Chronic inflammation or cystitis cannot be excluded. 7. Stable fibroid within uterus.  Results for orders placed or performed in visit on 09/18/14  POCT urinalysis dipstick  Result Value Ref Range   Color, UA yellow    Clarity, UA clear    Glucose, UA negative    Bilirubin, UA negative    Ketones, UA negative    Spec Grav, UA 1.025    Blood, UA negative    pH, UA 6.0    Protein, UA negative    Urobilinogen, UA 1.0    Nitrite, UA negative    Leukocytes, UA Negative Negative    ASSESSMENT AND PLAN:  64 y.o. year old female with  This chart was scribed in my presence and reviewed by me personally.    ICD-9-CM ICD-10-CM   1. Chronic  flank pain 789.09 R10.10 POCT urinalysis dipstick   338.29 G89.29 Ambulatory referral to Gynecology     dicyclomine (BENTYL) 10 MG capsule  2. Uterine leiomyoma, unspecified location 218.9 D25.9 Ambulatory referral to Gynecology     dicyclomine (BENTYL) 10 MG capsule   Signed, [name], MD 09/18/2014 2:41 PM

## 2015-01-27 ENCOUNTER — Telehealth: Payer: Self-pay | Admitting: *Deleted

## 2015-01-27 NOTE — Telephone Encounter (Signed)
Please call pt [phone]  She is still having pain.  MRI shows polyps on ovaries.  Per Dr. Joseph Art she needs to go to womens or we can refer her to GYN.  Pt was not satisfied with what I had to say and was upset that we missed all of this when she was coming in here.  She would like a call from Dr Joseph Art.

## 2015-02-07 ENCOUNTER — Ambulatory Visit: Payer: Medicaid Other | Admitting: Obstetrics and Gynecology

## 2015-06-11 ENCOUNTER — Emergency Department (HOSPITAL_COMMUNITY)
Admission: EM | Admit: 2015-06-11 | Discharge: 2015-06-11 | Disposition: A | Discharge status: Home / Self Care | Payer: Medicaid Other | Attending: Emergency Medicine | Admitting: Emergency Medicine

## 2015-06-11 ENCOUNTER — Encounter (HOSPITAL_COMMUNITY): Payer: Self-pay | Admitting: Emergency Medicine

## 2015-06-11 DIAGNOSIS — R109 Unspecified abdominal pain: Secondary | ICD-10-CM | POA: Insufficient documentation

## 2015-06-11 DIAGNOSIS — G8929 Other chronic pain: Secondary | ICD-10-CM | POA: Insufficient documentation

## 2015-06-11 HISTORY — DX: Fibromyalgia: M79.7

## 2015-06-11 LAB — CBC
HEMATOCRIT: 39.8 % (ref 36.0–46.0)
HEMOGLOBIN: 12.3 g/dL (ref 12.0–15.0)
MCH: 25.8 pg — ABNORMAL LOW (ref 26.0–34.0)
MCHC: 30.9 g/dL (ref 30.0–36.0)
MCV: 83.6 fL (ref 78.0–100.0)
Platelets: 338 10*3/uL (ref 150–400)
RBC: 4.76 MIL/uL (ref 3.87–5.11)
RDW: 13.5 % (ref 11.5–15.5)
WBC: 9.8 10*3/uL (ref 4.0–10.5)

## 2015-06-11 LAB — URINALYSIS, ROUTINE W REFLEX MICROSCOPIC
Bilirubin Urine: NEGATIVE
Glucose, UA: NEGATIVE mg/dL
Hgb urine dipstick: NEGATIVE
Ketones, ur: NEGATIVE mg/dL
LEUKOCYTES UA: NEGATIVE
NITRITE: NEGATIVE
PH: 6 (ref 5.0–8.0)
Protein, ur: NEGATIVE mg/dL
SPECIFIC GRAVITY, URINE: 1.025 (ref 1.005–1.030)

## 2015-06-11 LAB — COMPREHENSIVE METABOLIC PANEL
ALBUMIN: 3.4 g/dL — AB (ref 3.5–5.0)
ALT: 27 U/L (ref 14–54)
AST: 33 U/L (ref 15–41)
Alkaline Phosphatase: 82 U/L (ref 38–126)
Anion gap: 8 (ref 5–15)
BILIRUBIN TOTAL: 0.3 mg/dL (ref 0.3–1.2)
BUN: 19 mg/dL (ref 6–20)
CO2: 25 mmol/L (ref 22–32)
CREATININE: 0.92 mg/dL (ref 0.44–1.00)
Calcium: 9.1 mg/dL (ref 8.9–10.3)
Chloride: 103 mmol/L (ref 101–111)
GFR calc Af Amer: >60 mL/min (ref >60)
GLUCOSE: 104 mg/dL — AB (ref 65–99)
POTASSIUM: 3.5 mmol/L (ref 3.5–5.1)
Sodium: 136 mmol/L (ref 135–145)
TOTAL PROTEIN: 7.4 g/dL (ref 6.5–8.1)

## 2015-06-11 LAB — LIPASE, BLOOD: Lipase: 27 U/L (ref 11–51)

## 2015-06-11 NOTE — ED Notes (Signed)
Pt decided to leave. Pt was ask to stay and be retriaged. Pt declined.

## 2015-06-11 NOTE — ED Notes (Signed)
C/o pain to L side x 1 week.  Denies nausea, vomiting, diarrhea, and constipation.

## 2015-08-22 IMAGING — CT CT ABD-PELV W/ CM
2 of 5 series · 15 of 46 positions shown, 17 images · IV contrast (APPLIED)
Comparison: 01/15/2010

CLINICAL DATA: Intermittent left lower quadrant pain for 2 months,
diarrhea, fever

EXAM:
CT ABDOMEN AND PELVIS WITH CONTRAST
TECHNIQUE: Multidetector CT imaging of the abdomen and pelvis was performed
using the standard protocol following bolus administration of
intravenous contrast.
CONTRAST:  100mL OMNIPAQUE IOHEXOL 300 MG/ML  SOLN

[Series 2: abd pelvis 5.0 i41s 1 · axial · 0.88mm/px · z∈[-478,-54]mm · 12 of 96 slices shown, 14 images]
[im 6/96  soft-tissue]
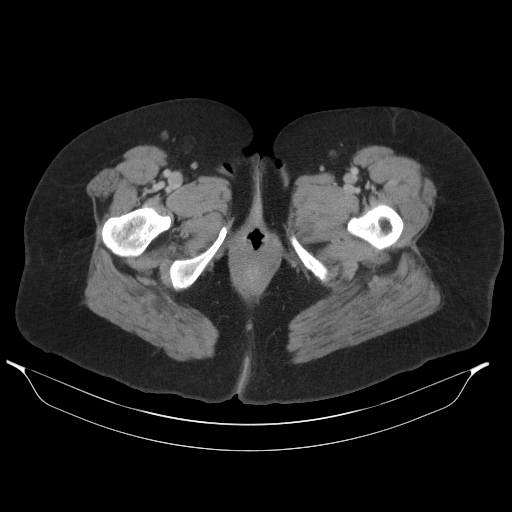
[im 6/96  bone]
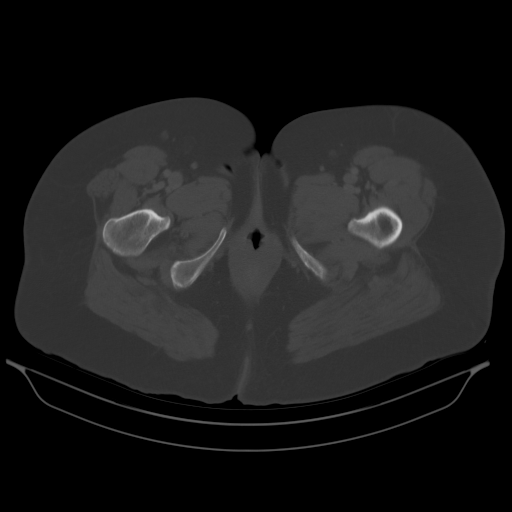
[im 16/96  soft-tissue]
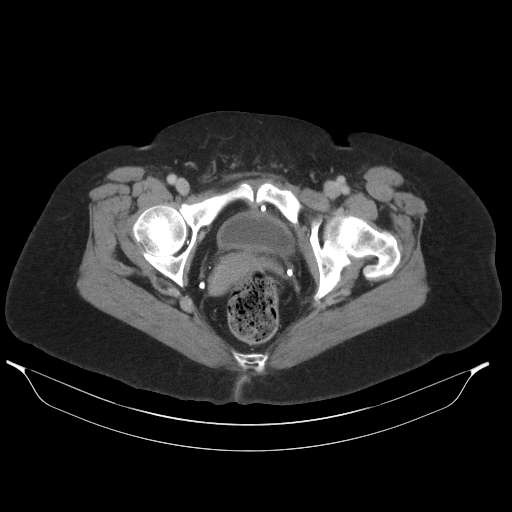
[im 21/96  soft-tissue]
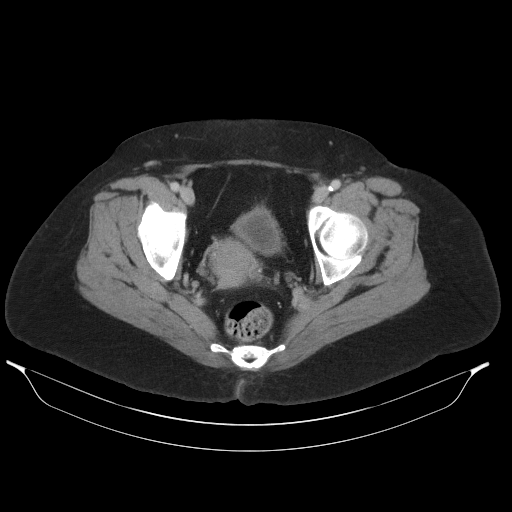
[im 31/96  soft-tissue]
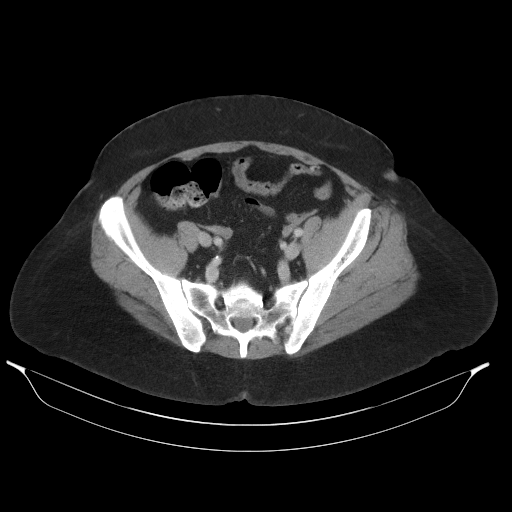
[im 36/96  soft-tissue]
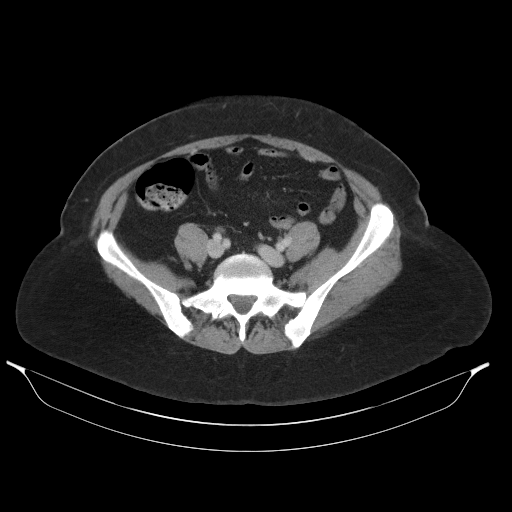
[im 46/96  soft-tissue]
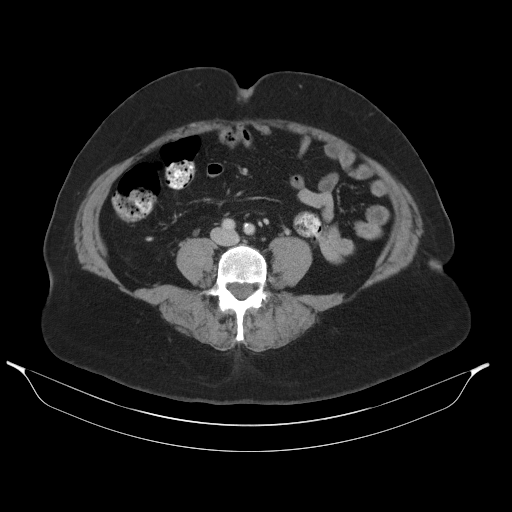
[im 51/96  soft-tissue]
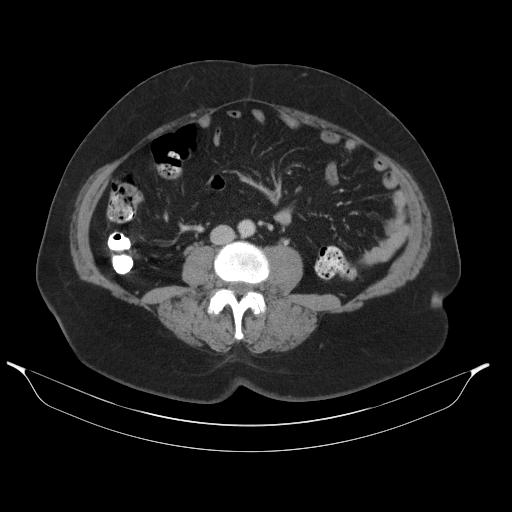
[im 61/96  soft-tissue]
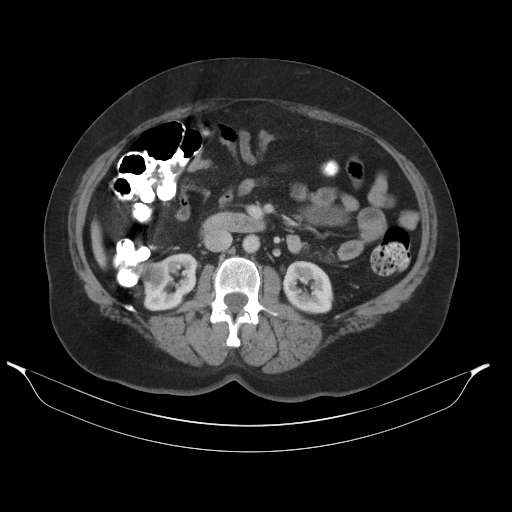
[im 66/96  soft-tissue]
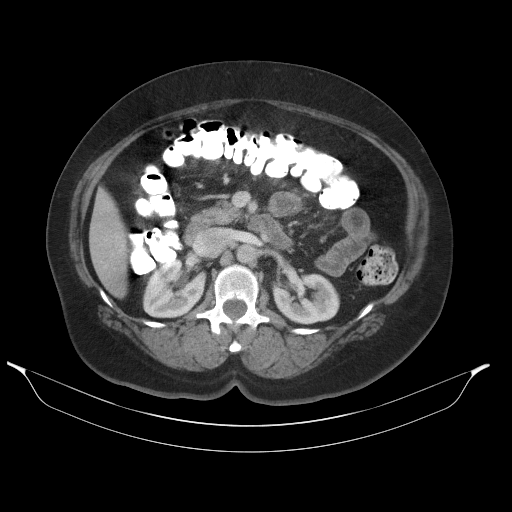
[im 66/96  bone]
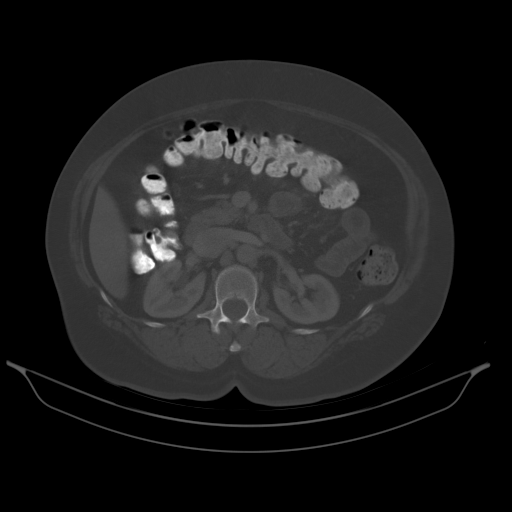
[im 76/96  soft-tissue]
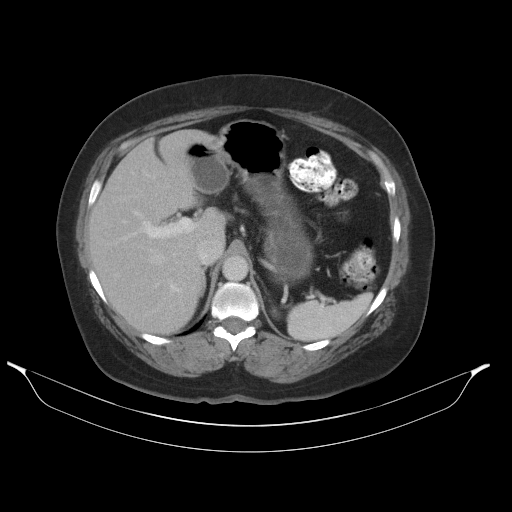
[im 81/96  soft-tissue]
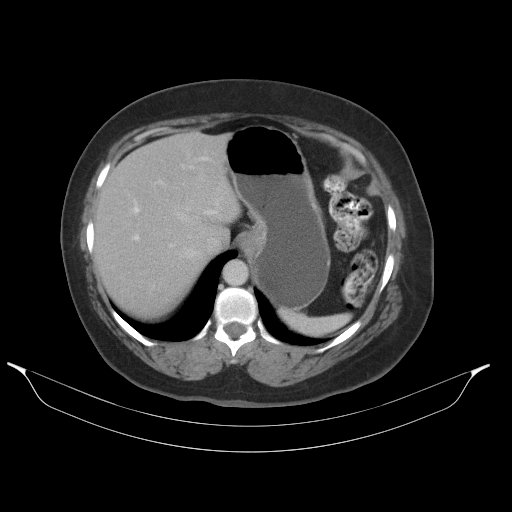
[im 91/96  soft-tissue]
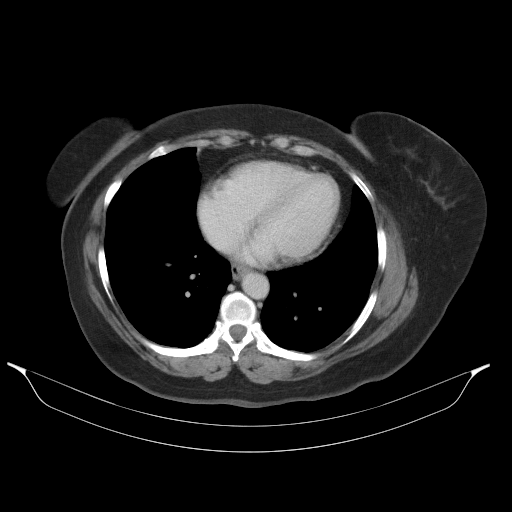

[Series 3: abd pelvis 3.0 spo cor · coronal · 0.72mm/px · 3 of 82 slices shown]
[im 28/82  soft-tissue]
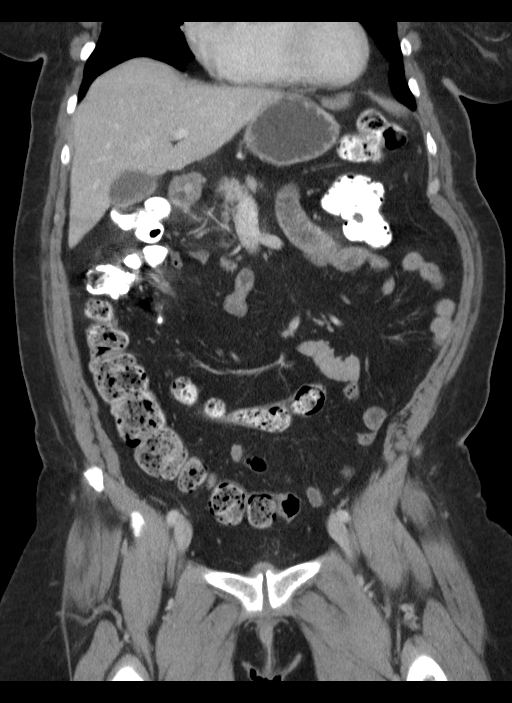
[im 37/82  soft-tissue]
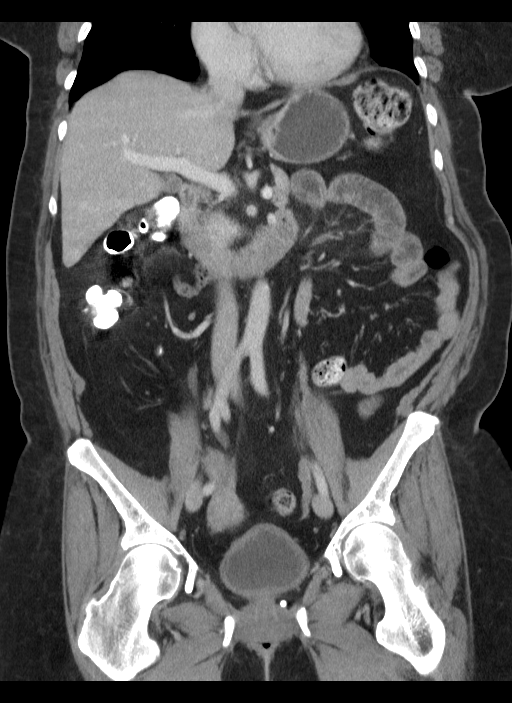
[im 46/82  soft-tissue]
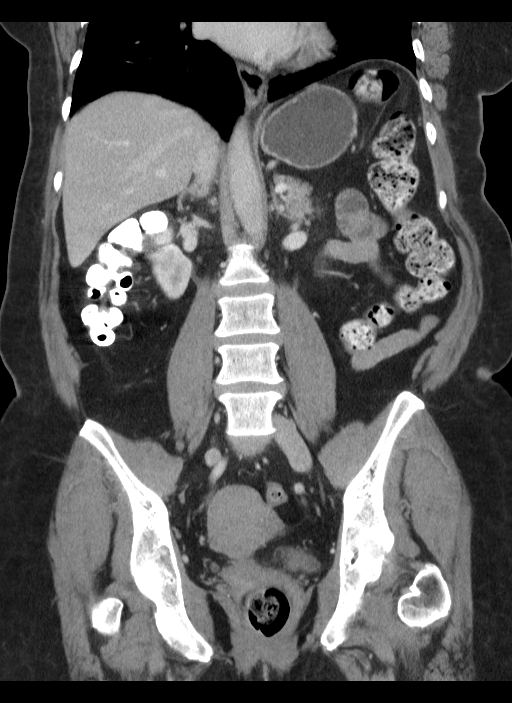

[15 of 46 positions shown; findings below may reference images not displayed]

FINDINGS: Sagittal images of the spine shows mild degenerative changes lumbar
spine. The lung bases are unremarkable. Enhanced liver is
unremarkable. No calcified gallstones are noted within gallbladder.

Enhanced pancreas, spleen and adrenal glands are unremarkable.

No aortic aneurysm. Enhanced kidneys are symmetrical in size. No
hydronephrosis or hydroureter. Again noted again noted a cyst in mid
to lower pole of the right kidney measures 2.3 cm stable from prior
exam.

There is no aortic aneurysm.

No small bowel obstruction.  No ascites or free air.  No adenopathy.

There is no pericecal inflammation. There is a high position of the
cecum in right upper abdomen anteriorly about the level of lower
pole of the right kidney. Normal appendix in a retrocecal position
is noted in axial image 49. No surrounding inflammation.

There is a markedly redundant sigmoid colon. The sigmoid colon is
looping in right upper quadrant just below the cecum. Abundant stool
is noted in sigmoid colon. There is moderate colonic gas in sigmoid
colon. Moderate stool is noted within rectum. There is no colonic
obstruction. Again noted a fibroid within posterior wall of the
fundus of the uterus measures 3.3 cm. There is no significant
uterine enlargement. No adnexal mass. Pelvic phleboliths are noted.
There is mild thickening of urinary bladder wall. Clinical
correlation is necessary to exclude chronic inflammation or
cystitis. Delayed renal images shows bilateral renal symmetrical
excretion. Bilateral visualized proximal ureter is unremarkable.
IMPRESSION: 1. No acute inflammatory process within abdomen.
2. Somewhat high position of the cecum. No pericecal inflammation.
Normal retrocecal appendix.
3. Markedly redundant sigmoid colon as described above. Moderate
stool is noted within sigmoid colon. Moderate stool within rectum.
There is no evidence of distal colonic obstruction.
4. Mild degenerative changes lumbar spine.
5. No hydronephrosis or hydroureter.  Stable right renal cysts.
6. There is mild thickening of urinary bladder wall. Chronic
inflammation or cystitis cannot be excluded.
7. Stable fibroid within uterus.

## 2016-02-21 ENCOUNTER — Encounter (HOSPITAL_BASED_OUTPATIENT_CLINIC_OR_DEPARTMENT_OTHER): Payer: Self-pay | Admitting: *Deleted

## 2016-02-21 ENCOUNTER — Emergency Department (HOSPITAL_BASED_OUTPATIENT_CLINIC_OR_DEPARTMENT_OTHER): Payer: BLUE CROSS/BLUE SHIELD

## 2016-02-21 ENCOUNTER — Emergency Department (HOSPITAL_BASED_OUTPATIENT_CLINIC_OR_DEPARTMENT_OTHER)
Admission: EM | Admit: 2016-02-21 | Discharge: 2016-02-21 | Disposition: A | Discharge status: Home / Self Care | Payer: BLUE CROSS/BLUE SHIELD | Attending: Emergency Medicine | Admitting: Emergency Medicine

## 2016-02-21 DIAGNOSIS — R110 Nausea: Secondary | ICD-10-CM | POA: Insufficient documentation

## 2016-02-21 DIAGNOSIS — R509 Fever, unspecified: Secondary | ICD-10-CM | POA: Insufficient documentation

## 2016-02-21 DIAGNOSIS — R05 Cough: Secondary | ICD-10-CM | POA: Diagnosis present

## 2016-02-21 DIAGNOSIS — R5383 Other fatigue: Secondary | ICD-10-CM | POA: Diagnosis not present

## 2016-02-21 DIAGNOSIS — R51 Headache: Secondary | ICD-10-CM | POA: Diagnosis not present

## 2016-02-21 DIAGNOSIS — M791 Myalgia: Secondary | ICD-10-CM | POA: Insufficient documentation

## 2016-02-21 DIAGNOSIS — J029 Acute pharyngitis, unspecified: Secondary | ICD-10-CM | POA: Diagnosis not present

## 2016-02-21 DIAGNOSIS — R69 Illness, unspecified: Secondary | ICD-10-CM

## 2016-02-21 DIAGNOSIS — Z79899 Other long term (current) drug therapy: Secondary | ICD-10-CM | POA: Insufficient documentation

## 2016-02-21 DIAGNOSIS — J111 Influenza due to unidentified influenza virus with other respiratory manifestations: Secondary | ICD-10-CM

## 2016-02-21 LAB — CBC WITH DIFFERENTIAL/PLATELET
BASOS ABS: 0 10*3/uL (ref 0.0–0.1)
BASOS PCT: 0 %
Eosinophils Absolute: 0 10*3/uL (ref 0.0–0.7)
Eosinophils Relative: 0 %
HEMATOCRIT: 40.2 % (ref 36.0–46.0)
Hemoglobin: 13.2 g/dL (ref 12.0–15.0)
Lymphocytes Relative: 33 %
Lymphs Abs: 1.5 10*3/uL (ref 0.7–4.0)
MCH: 26.9 pg (ref 26.0–34.0)
MCHC: 32.8 g/dL (ref 30.0–36.0)
MCV: 82 fL (ref 78.0–100.0)
MONO ABS: 0.8 10*3/uL (ref 0.1–1.0)
Monocytes Relative: 18 %
NEUTROS ABS: 2.2 10*3/uL (ref 1.7–7.7)
NEUTROS PCT: 49 %
Platelets: 259 10*3/uL (ref 150–400)
RBC: 4.9 MIL/uL (ref 3.87–5.11)
RDW: 14 % (ref 11.5–15.5)
WBC: 4.5 10*3/uL (ref 4.0–10.5)

## 2016-02-21 LAB — COMPREHENSIVE METABOLIC PANEL
ALBUMIN: 3.3 g/dL — AB (ref 3.5–5.0)
ALT: 24 U/L (ref 14–54)
AST: 40 U/L (ref 15–41)
Alkaline Phosphatase: 62 U/L (ref 38–126)
Anion gap: 6 (ref 5–15)
BILIRUBIN TOTAL: 0.5 mg/dL (ref 0.3–1.2)
BUN: 13 mg/dL (ref 6–20)
CHLORIDE: 105 mmol/L (ref 101–111)
CO2: 26 mmol/L (ref 22–32)
Calcium: 8.3 mg/dL — ABNORMAL LOW (ref 8.9–10.3)
Creatinine, Ser: 0.92 mg/dL (ref 0.44–1.00)
GFR calc Af Amer: >60 mL/min (ref >60)
GFR calc non Af Amer: >60 mL/min (ref >60)
Glucose, Bld: 103 mg/dL — ABNORMAL HIGH (ref 65–99)
POTASSIUM: 3.5 mmol/L (ref 3.5–5.1)
Sodium: 137 mmol/L (ref 135–145)
TOTAL PROTEIN: 7.1 g/dL (ref 6.5–8.1)

## 2016-02-21 LAB — RAPID STREP SCREEN (MED CTR MEBANE ONLY): STREPTOCOCCUS, GROUP A SCREEN (DIRECT): NEGATIVE

## 2016-02-21 MED ORDER — KETOROLAC TROMETHAMINE 30 MG/ML IJ SOLN
30.0000 mg | Freq: Once | INTRAMUSCULAR | Status: AC
  Administered 2016-02-21: 30 mg via INTRAVENOUS
  Filled 2016-02-21: qty 1

## 2016-02-21 MED ORDER — ONDANSETRON HCL 8 MG PO TABS: 4.0000 mg | ORAL_TABLET | Freq: Once | ORAL | Status: DC

## 2016-02-21 MED ORDER — KETOROLAC TROMETHAMINE 30 MG/ML IJ SOLN
30.0000 mg | Freq: Once | INTRAMUSCULAR | Status: DC
  Filled 2016-02-21: qty 1

## 2016-02-21 MED ORDER — SODIUM CHLORIDE 0.9 % IV BOLUS (SEPSIS)
1000.0000 mL | Freq: Once | INTRAVENOUS | Status: AC
  Administered 2016-02-21: 1000 mL via INTRAVENOUS

## 2016-02-21 MED ORDER — ONDANSETRON 4 MG PO TBDP
4.0000 mg | ORAL_TABLET | Freq: Once | ORAL | Status: AC
  Administered 2016-02-21: 4 mg via ORAL
  Filled 2016-02-21: qty 1

## 2016-02-21 NOTE — ED Triage Notes (Addendum)
Fever, chest soreness with cough, body aches and vomiting x 4 days. She has not taken fever reducer, used her inhaler or tried OTC pain meds today.

## 2016-02-21 NOTE — Discharge Instructions (Signed)
Please drink plenty of fluids to remain well hydrated and use ibuprofen for pain and fever. Follow up with a primary care provider from the list provided to establish care. Return if your symptoms worsen or you experience new concerning symptoms.

## 2016-02-21 NOTE — ED Provider Notes (Signed)
[facility] DEPT [facility] Provider Note   CSN: [id number] Arrival date & time: 02/21/16  1816   By signing my name below, I, Delton Prairie, attest that this documentation has been prepared under the direction and in the presence of  Avie Echevaria, PA-C. Electronically Signed: Delton Prairie, ED Scribe. 02/21/16. 7:15 PM.   History   Chief Complaint Chief Complaint  Patient presents with  . Fever    The history is provided by the patient. No language interpreter was used.   HPI Comments:  Kelly Rosales is a 66 y.o. female, with a hx of asthma, who presents to the Emergency Department complaining of heavy, pressurized headache to the top of her head onset 1 week. Pt also reports a productive cough with streaks of blood, chest discomfort secondary to coughing, generalized body aches, fatigue, decrease in appetite, sore throat, subjective fevers, chills, nausea, decrease in fluid intake and loose stools. She has taken Aleve and has been drinking hot tea with no significant relief. Pt denies vomiting, SOB, difficulty urinating, dysuria or any other associated symptoms.   Past Medical History:  Diagnosis Date  . Achilles tendonitis 10/2010  . Ankle deformity 10/2010  . Chronic abdominal pain   . Chronic back pain   . Chronic leg pain    left  . Chronic neck pain   . Fibromyalgia   . IBS (irritable bowel syndrome)   . Lumbar radiculopathy 10/2010  . Peripheral neuropathy ([place]) 10/2010   Secondary to Lumbar Radiculopathy    Patient Active Problem List   Diagnosis Date Noted  . Uterine fibroid 09/18/2014  . HYPERLIPIDEMIA 02/10/2009  . IRRITABLE BOWEL SYNDROME 02/10/2009  . COLONIC POLYPS, HYPERPLASTIC, HX OF 02/10/2009  . ABDOMINAL PAIN, GENERALIZED 12/07/2008    Past Surgical History:  Procedure Laterality Date  . [place] Bunionectomy Left 2003  . Cotton Osteotomy w/ graft Left 04/10/12  . FOOT SURGERY    . Nail Matrixectomy Left 04/10/12  . TAL Lengthening Left 04/10/2012      OB History    No data available       Home Medications    Prior to Admission medications   Medication Sig Start Date End Date Taking? Authorizing Provider  albuterol (PROVENTIL HFA;VENTOLIN HFA) 108 (90 BASE) MCG/ACT inhaler Inhale 2 puffs into the lungs every 6 (six) hours as needed for wheezing or shortness of breath.   Yes Historical Provider, MD  dicyclomine (BENTYL) 10 MG capsule Take 1 capsule (10 mg total) by mouth 3 (three) times daily as needed for spasms. 09/18/14   [name], MD  lisinopril-hydrochlorothiazide (PRINZIDE,ZESTORETIC) 20-25 MG per tablet Take 1 tablet by mouth daily. 06/10/14   Historical Provider, MD  oxyCODONE (ROXICODONE) 15 MG immediate release tablet Take 15 mg by mouth 3 (three) times daily. 06/10/14   Historical Provider, MD  ranitidine (ZANTAC) 150 MG tablet Take 1 tablet (150 mg total) by mouth 2 (two) times daily. 07/12/14   Tori Milks, MD    Family History Family History  Problem Relation Age of Onset  . Cancer Mother   . Diabetes Mother   . Heart disease Mother   . Hyperlipidemia Mother   . Hypertension Mother     Social History Social History  Substance Use Topics  . Smoking status: Never Smoker  . Smokeless tobacco: Never Used  . Alcohol use No     Allergies   Demerol [meperidine]; Morphine and related; and Penicillins   Review of Systems Review of Systems  Constitutional: Positive for  activity change (decrease), chills, fatigue and fever (subjective).  HENT: Positive for sore throat.   Respiratory: Positive for cough. Negative for shortness of breath.   Gastrointestinal: Positive for nausea. Negative for blood in stool, diarrhea and vomiting.  Genitourinary: Negative for difficulty urinating and dysuria.  Musculoskeletal: Positive for myalgias.  Neurological: Positive for headaches.   Physical Exam Updated Vital Signs BP 136/81 (BP Location: Left Arm)   Pulse 62   Temp 98.9 F (37.2 C) (Oral)   Resp 18   Ht 5\' 6"   (1.676 m)   Wt 88 kg   SpO2 96%   BMI 31.31 kg/m   Physical Exam  Constitutional: She is oriented to person, place, and time. She appears well-developed and well-nourished. No distress.  Patient is afebrile, non-toxic appearing, seating comfortably in chair in no acute distress.   HENT:  Head: Normocephalic and atraumatic.  Mouth/Throat: Oropharynx is clear and moist. No oropharyngeal exudate.  Eyes: Conjunctivae and EOM are normal. Pupils are equal, round, and reactive to light.  Neck: Normal range of motion. Neck supple.  Cardiovascular: Normal rate, regular rhythm and normal heart sounds.   Pulmonary/Chest: Effort normal and breath sounds normal.  Abdominal: Soft. Bowel sounds are normal. She exhibits no distension. There is no tenderness. There is no rebound and no guarding.  Neurological: She is alert and oriented to person, place, and time.  Skin: Skin is warm and dry.  Psychiatric: She has a normal mood and affect.  Nursing note and vitals reviewed.  ED Treatments / Results  DIAGNOSTIC STUDIES:  Oxygen Saturation is 97% on RA, normal by my interpretation.    COORDINATION OF CARE:  7:12 PM Discussed treatment plan with pt at bedside and pt agreed to plan.  Labs (all labs ordered are listed, but only abnormal results are displayed) Labs Reviewed  COMPREHENSIVE METABOLIC PANEL - Abnormal; Notable for the following:       Result Value   Glucose, Bld 103 (*)    Calcium 8.3 (*)    Albumin 3.3 (*)    All other components within normal limits  RAPID STREP SCREEN (NOT AT [facility])  CULTURE, GROUP A STREP ([place])  CBC WITH DIFFERENTIAL/PLATELET    EKG  EKG Interpretation None       Radiology Dg Chest 2 View  Result Date: 02/21/2016 CLINICAL DATA:  Acute onset of productive cough and hemoptysis. Generalized chest discomfort. Fatigue and decrease in appetite. Subjective fever, chills, nausea and sore throat. Initial encounter. EXAM: CHEST  2 VIEW COMPARISON:  Chest  radiograph performed 08/27/2013 FINDINGS: The lungs are well-aerated and clear. There is no evidence of focal opacification, pleural effusion or pneumothorax. The heart is borderline normal in size. No acute osseous abnormalities are seen. IMPRESSION: No acute cardiopulmonary process seen. Electronically Signed   By: Garald Balding M.D.   On: 02/21/2016 19:51    Procedures Procedures (including critical care time)  Medications Ordered in ED Medications  sodium chloride 0.9 % bolus 1,000 mL (0 mLs Intravenous Stopped 02/21/16 2028)  ondansetron (ZOFRAN-ODT) disintegrating tablet 4 mg (4 mg Oral Given 02/21/16 1931)  ketorolac (TORADOL) 30 MG/ML injection 30 mg (30 mg Intravenous Given 02/21/16 2028)     Initial Impression / Assessment and Plan / ED Course  I have reviewed the triage vital signs and the nursing notes.  Pertinent labs & imaging results that were available during my care of the patient were reviewed by me and considered in my medical decision making (see chart for details).  Patient with symptoms consistent with influenza.  Vitals are stable.  No signs of dehydration, tolerating PO's.  Lungs are clear.   CXR negative. When reassessed after fluids and toradol, she reported feeling much better and was no longer nauseated. She had changed back into her clothes and was ready to go home.  The patient understands that symptoms are greater than the recommended 24-48 hour window of treatment with tamiflu. Patient will be discharged with instructions to orally hydrate, rest, and use over-the-counter medications such as anti-inflammatories ibuprofen and Aleve for muscle aches and Tylenol for fever.    Discharge home with PCP follow up. Patient provided with resources.  Discussed strict return precautions. Patient was advised to return to the emergency department if experiencing any new or worsening symptoms. Patient clearly understood instructions and agreed with discharge  plan.  Patient was discussed with Dr. Laverta Baltimore who agrees with assessment and plan.  Final Clinical Impressions(s) / ED Diagnoses   Final diagnoses:  Influenza-like illness    New Prescriptions New Prescriptions   No medications on file  I personally performed the services described in this documentation, which was scribed in my presence. The recorded information has been reviewed and is accurate.    Emeline General, PA-C 02/21/16 [place], MD 02/22/16 [phone]

## 2016-02-21 NOTE — ED Notes (Signed)
Motrin offered. Pt refused.

## 2016-02-24 LAB — CULTURE, GROUP A STREP (THRC)

## 2016-04-25 ENCOUNTER — Encounter (HOSPITAL_COMMUNITY): Payer: Self-pay | Admitting: Emergency Medicine

## 2016-04-25 ENCOUNTER — Emergency Department (HOSPITAL_COMMUNITY)
Admission: EM | Admit: 2016-04-25 | Discharge: 2016-04-25 | Disposition: A | Discharge status: Home / Self Care | Payer: BLUE CROSS/BLUE SHIELD | Attending: Emergency Medicine | Admitting: Emergency Medicine

## 2016-04-25 DIAGNOSIS — R51 Headache: Secondary | ICD-10-CM | POA: Diagnosis present

## 2016-04-25 DIAGNOSIS — I10 Essential (primary) hypertension: Secondary | ICD-10-CM | POA: Diagnosis not present

## 2016-04-25 MED ORDER — HYDROCHLOROTHIAZIDE 12.5 MG PO TABS: 12.5000 mg | ORAL_TABLET | Freq: Every day | ORAL | 3 refills | Status: DC

## 2016-04-25 NOTE — ED Provider Notes (Signed)
[facility] DEPT Provider Note   CSN: [id number] Arrival date & time: 04/25/16  1629     History   Chief Complaint Chief Complaint  Patient presents with  . Headache  . left arm pain    HPI Kelly Rosales is a 66 y.o. female.  Patient presents for elevated blood pressures, taken at home described as "179/110, and 149/127."  She also has intermittent headache, blurred vision, left arm pain, dizziness, and general weakness.  She was on hydrochlorothiazide until several weeks ago, when she ran out.  She recently had a dental extraction, for dental pain, about 1 month ago.  When she had the extraction she was told that her blood pressure was high.  She denies syncope, paresthesias, falling, shortness of breath or cough.  There are no other known modifying factors.  HPI  Past Medical History:  Diagnosis Date  . Achilles tendonitis 10/2010  . Ankle deformity 10/2010  . Chronic abdominal pain   . Chronic back pain   . Chronic leg pain    left  . Chronic neck pain   . Fibromyalgia   . IBS (irritable bowel syndrome)   . Lumbar radiculopathy 10/2010  . Peripheral neuropathy 10/2010   Secondary to Lumbar Radiculopathy    Patient Active Problem List   Diagnosis Date Noted  . Uterine fibroid 09/18/2014  . HYPERLIPIDEMIA 02/10/2009  . IRRITABLE BOWEL SYNDROME 02/10/2009  . COLONIC POLYPS, HYPERPLASTIC, HX OF 02/10/2009  . ABDOMINAL PAIN, GENERALIZED 12/07/2008    Past Surgical History:  Procedure Laterality Date  . [place] Bunionectomy Left 2003  . Cotton Osteotomy w/ graft Left 04/10/12  . FOOT SURGERY    . Nail Matrixectomy Left 04/10/12  . TAL Lengthening Left 04/10/2012    OB History    No data available       Home Medications    Prior to Admission medications   Medication Sig Start Date End Date Taking? Authorizing Provider  albuterol (PROVENTIL HFA;VENTOLIN HFA) 108 (90 BASE) MCG/ACT inhaler Inhale 2 puffs into the lungs every 6 (six) hours as needed for  wheezing or shortness of breath.   Yes Historical Provider, MD  dicyclomine (BENTYL) 10 MG capsule Take 1 capsule (10 mg total) by mouth 3 (three) times daily as needed for spasms. Patient not taking: Reported on 04/25/2016 09/18/14   [name], MD  hydrochlorothiazide (HYDRODIURIL) 12.5 MG tablet Take 1 tablet (12.5 mg total) by mouth daily. 04/25/16   Daleen Bo, MD  ranitidine (ZANTAC) 150 MG tablet Take 1 tablet (150 mg total) by mouth 2 (two) times daily. Patient not taking: Reported on 04/25/2016 07/12/14   Tori Milks, MD    Family History Family History  Problem Relation Age of Onset  . Cancer Mother   . Diabetes Mother   . Heart disease Mother   . Hyperlipidemia Mother   . Hypertension Mother     Social History Social History  Substance Use Topics  . Smoking status: Never Smoker  . Smokeless tobacco: Never Used  . Alcohol use No     Allergies   Demerol [meperidine]; Morphine and related; and Penicillins   Review of Systems Review of Systems  All other systems reviewed and are negative.    Physical Exam Updated Vital Signs BP 129/88 (BP Location: Left Arm)   Pulse 61   Temp 98.5 F (36.9 C) (Oral)   Resp 18   Ht 5\' 7"  (1.702 m)   Wt 182 lb (82.6 kg)   SpO2 96%  BMI 28.51 kg/m   Physical Exam  Constitutional: She is oriented to person, place, and time. She appears well-developed and well-nourished.  HENT:  Head: Normocephalic and atraumatic.  Eyes: Conjunctivae and EOM are normal. Pupils are equal, round, and reactive to light.  Neck: Normal range of motion and phonation normal. Neck supple.  Cardiovascular: Normal rate and regular rhythm.   Pulmonary/Chest: Effort normal and breath sounds normal. She exhibits no tenderness.  Abdominal: Soft. She exhibits no distension. There is no tenderness. There is no guarding.  Musculoskeletal: Normal range of motion.  Neurological: She is alert and oriented to person, place, and time. She exhibits normal  muscle tone.  No dysarthria or aphasia or nystagmus.  Normal gait.  No pronator drift.  No ataxia.  Skin: Skin is warm and dry.  Psychiatric: She has a normal mood and affect. Her behavior is normal. Judgment and thought content normal.  Nursing note and vitals reviewed.    ED Treatments / Results  Labs (all labs ordered are listed, but only abnormal results are displayed) Labs Reviewed - No data to display  EKG  EKG Interpretation None       Radiology No results found.  Procedures Procedures (including critical care time)  Medications Ordered in ED Medications - No data to display   Initial Impression / Assessment and Plan / ED Course  I have reviewed the triage vital signs and the nursing notes.  Pertinent labs & imaging results that were available during my care of the patient were reviewed by me and considered in my medical decision making (see chart for details).     Medications - No data to display  Patient Vitals for the past 24 hrs:  BP Temp Temp src Pulse Resp SpO2 Height Weight  04/25/16 1937 129/88 - - 61 18 96 % - -  04/25/16 1749 119/77 98.5 F (36.9 C) Oral 65 12 96 % - -  04/25/16 1646 125/81 98.7 F (37.1 C) Oral 74 18 97 % - -  04/25/16 1634 130/74 98.2 F (36.8 C) Oral 78 17 98 % 5\' 7"  (1.702 m) 182 lb (82.6 kg)    7:57 PM Reevaluation with update and discussion. After initial assessment and treatment, an updated evaluation reveals findings discussed with patient and husband, all questions answered. [name] L    Final Clinical Impressions(s) / ED Diagnoses   Final diagnoses:  Hypertension, unspecified type   Hypertension, currently off medications, with normal blood pressure in the ED.  Somewhat concerning symptoms however during evaluation in the ED she was asymptomatic, with a normal neurologic exam.  Doubt ACS, PE or impending vascular collapse.  Nursing Notes Reviewed/ Care Coordinated Applicable Imaging Reviewed Interpretation  of Laboratory Data incorporated into ED treatment  The patient appears reasonably screened and/or stabilized for discharge and I doubt any other medical condition or other [facility] requiring further screening, evaluation, or treatment in the ED at this time prior to discharge.  Plan: Home Medications-continue usual, refill ordered on usually prescribed medication, HCTZ; Home Treatments-rest, avoid salt; return here if the recommended treatment, does not improve the symptoms; Recommended follow up-PCP checkup in 2 or 3 weeks.    New Prescriptions New Prescriptions   HYDROCHLOROTHIAZIDE (HYDRODIURIL) 12.5 MG TABLET    Take 1 tablet (12.5 mg total) by mouth daily.     Daleen Bo, MD 04/25/16 [phone]

## 2016-04-25 NOTE — Discharge Instructions (Signed)
Continue to avoid salt as much as possible.  Also avoid eating concentrated sweets, to keep your blood sugar low.  Make sure you are drinking plenty of water each day.  Follow-up with your doctor, for a checkup in 2 or 3 weeks.

## 2016-04-25 NOTE — ED Notes (Addendum)
Ptr is alert and oriented x 4 and is c/o head pain right and left side posteriorly 8/10 aches. Pt states that she has had the pain x 1 week. Pt  Husband is at bedside. Pt states that her BP has been elevated the past few weeks SBP in the 170's.

## 2016-04-25 NOTE — ED Triage Notes (Signed)
Patient c/o headache that has been intermittent over month. Patient also c/o left arm pain x week. Patient denies any injuries to cause arm pain.

## 2016-08-06 ENCOUNTER — Encounter: Payer: Self-pay | Admitting: Podiatry

## 2016-08-06 ENCOUNTER — Ambulatory Visit (INDEPENDENT_AMBULATORY_CARE_PROVIDER_SITE_OTHER): Payer: Medicaid Other | Admitting: Podiatry

## 2016-08-06 DIAGNOSIS — M7742 Metatarsalgia, left foot: Secondary | ICD-10-CM | POA: Diagnosis not present

## 2016-08-06 DIAGNOSIS — M79672 Pain in left foot: Secondary | ICD-10-CM | POA: Diagnosis not present

## 2016-08-06 DIAGNOSIS — M216X2 Other acquired deformities of left foot: Secondary | ICD-10-CM

## 2016-08-06 DIAGNOSIS — G8929 Other chronic pain: Secondary | ICD-10-CM

## 2016-08-06 DIAGNOSIS — M216X9 Other acquired deformities of unspecified foot: Secondary | ICD-10-CM

## 2016-08-06 NOTE — Progress Notes (Signed)
Subjective: 66 year old female presents complaining of pain on bottom and top of forefoot with pain in toes duration of a year. Patient points metatarsal bones and MPJ area of top and bottom. Stated that she was having difficulty catching balance at times while in shower, off and on about a year.  Been tripping and falling regular base and got better for a while. Now it happens off and on.  Stated that she has not seen any doctors for this condition. Her last visit here was 06/09/12.  Objective: Vascular: All pedal pulses are palpable. No visible edema or erythema noted. No temperature change noted. Dermatologic: No open skin lesions noted. Neurologic: All epicritic and tactile sensations grossly intact.  Positive response to monofilament sensory testing bilateral.  Orthopedic: High arched cavus type foot with increased digital contracture 1-5 left foot.   Assessment: Rigid cavus type foot. Metatarsalgia left foot. Supinated rearfoot with poor shock absorption bilateral.  Frequent loss of balance.  Plan: Reviewed clinical findings and available treatment options. Advised Custom orthotics to address rigid poor shock absorption. Compounding cream prescribed for pain control.

## 2016-08-06 NOTE — Patient Instructions (Signed)
Seen for pain in left foot and poor balance. Noted of high arch rigid foot with poor shock absorption. May benefit from custom orthotics and possible from compounding cream. Will place order.  Return as needed.

## 2017-02-08 ENCOUNTER — Other Ambulatory Visit: Payer: Self-pay

## 2017-02-08 ENCOUNTER — Encounter (HOSPITAL_COMMUNITY): Payer: Self-pay | Admitting: *Deleted

## 2017-02-08 ENCOUNTER — Emergency Department (HOSPITAL_COMMUNITY)
Admission: EM | Admit: 2017-02-08 | Discharge: 2017-02-08 | Disposition: A | Discharge status: Home / Self Care | Payer: BLUE CROSS/BLUE SHIELD | Attending: Emergency Medicine | Admitting: Emergency Medicine

## 2017-02-08 DIAGNOSIS — I10 Essential (primary) hypertension: Secondary | ICD-10-CM | POA: Diagnosis not present

## 2017-02-08 DIAGNOSIS — Z79899 Other long term (current) drug therapy: Secondary | ICD-10-CM | POA: Insufficient documentation

## 2017-02-08 DIAGNOSIS — Y999 Unspecified external cause status: Secondary | ICD-10-CM | POA: Insufficient documentation

## 2017-02-08 DIAGNOSIS — X100XXA Contact with hot drinks, initial encounter: Secondary | ICD-10-CM | POA: Diagnosis not present

## 2017-02-08 DIAGNOSIS — T23001A Burn of unspecified degree of right hand, unspecified site, initial encounter: Secondary | ICD-10-CM | POA: Insufficient documentation

## 2017-02-08 DIAGNOSIS — Y9389 Activity, other specified: Secondary | ICD-10-CM | POA: Diagnosis not present

## 2017-02-08 DIAGNOSIS — Y929 Unspecified place or not applicable: Secondary | ICD-10-CM | POA: Diagnosis not present

## 2017-02-08 DIAGNOSIS — T300 Burn of unspecified body region, unspecified degree: Secondary | ICD-10-CM

## 2017-02-08 HISTORY — DX: Essential (primary) hypertension: I10

## 2017-02-08 MED ORDER — NAPROXEN 500 MG PO TABS: 500.0000 mg | ORAL_TABLET | Freq: Two times a day (BID) | ORAL | 0 refills | Status: DC

## 2017-02-08 NOTE — Discharge Instructions (Signed)
You were seen in the emergency department for a burn on to your right hand. Protect the blistered area as best you can. As long as the skin remains intact it is sterile (not infected).   Apply Neosporin (over the counter antibiotic ointment) to the burned area 3-4 times per day until the area has healed.   I have prescribed you Naproxen for the discomfort.  This is a nonsteroidal anti-inflammatory medication that will help with pain and swelling.  You may take this once every 12 hours as needed for pain and swelling, be sure to take this with food as it can cause stomach upset and at worst stomach bleeding.  Do not take Advil, ibuprofen, or Aleve while taking this medicine as it will further irritate your stomach.  You may supplement with Tylenol- per bottle dosing instructions.   Call plastic surgeon Dr. Marla Roe tomorrow to make an appointment within the next 5 days for re-evaluation of the burn.  Return to the emergency department for any new or worsening symptoms including, but not limited to increased redness or warmth around the burn, purulent discharge from the burn, fever, chills, nausea, or vomiting.   Additionally your blood pressure was elevated in the emergency department today- have this recheck by your primary care provider or the [place] [facility] in 1 week.   Vitals:   02/08/17 1032  BP: (!) 162/90  Pulse: (!) 58  Resp: 18  Temp: 98.2 F (36.8 C)  SpO2: 97%

## 2017-02-08 NOTE — ED Triage Notes (Signed)
Pt reports grabbing hot cup around 1 am and now has pain and blistering to index and ring finger.

## 2017-02-08 NOTE — ED Provider Notes (Signed)
[facility] Provider Note   CSN: [id number] Arrival date & time: 02/08/17  1017     History   Chief Complaint Chief Complaint  Patient presents with  . Burn    HPI Kelly Rosales is a 67 y.o. female with a hx of HTN and fibromyalgia who presents to the ED with burn to R fingers that occurred at 0100 this AM. Patient states she was picking up a hot cup of coffee causing the burn to her 1st - 4th digits and subsequent blistering of 2nd and 4th digits. Patient states she ran the fingers under cool water and applied a pain ointment of some kind, unknown name. Pain had immediate onset, she took some Aleve earlier today with improvement and it is currently a 4/10 in severity. Pain is worse with movement of the fingers. Denies numbness, weakness, or fevers. Last tetanus was 2 years prior. Patient is R hand dominant.   HPI  Past Medical History:  Diagnosis Date  . Achilles tendonitis 10/2010  . Ankle deformity 10/2010  . Chronic abdominal pain   . Chronic back pain   . Chronic leg pain    left  . Chronic neck pain   . Fibromyalgia   . Hypertension   . IBS (irritable bowel syndrome)   . Lumbar radiculopathy 10/2010  . Peripheral neuropathy 10/2010   Secondary to Lumbar Radiculopathy    Patient Active Problem List   Diagnosis Date Noted  . Uterine fibroid 09/18/2014  . HYPERLIPIDEMIA 02/10/2009  . IRRITABLE BOWEL SYNDROME 02/10/2009  . COLONIC POLYPS, HYPERPLASTIC, HX OF 02/10/2009  . ABDOMINAL PAIN, GENERALIZED 12/07/2008    Past Surgical History:  Procedure Laterality Date  . Austin Bunionectomy Left 2003  . Cotton Osteotomy w/ graft Left 04/10/12  . FOOT SURGERY    . Nail Matrixectomy Left 04/10/12  . TAL Lengthening Left 04/10/2012    OB History    No data available       Home Medications    Prior to Admission medications   Medication Sig Start Date End Date Taking? Authorizing Provider  albuterol (PROVENTIL HFA;VENTOLIN  HFA) 108 (90 BASE) MCG/ACT inhaler Inhale 2 puffs into the lungs every 6 (six) hours as needed for wheezing or shortness of breath.    [provider]  dicyclomine (BENTYL) 10 MG capsule Take 1 capsule (10 mg total) by mouth 3 (three) times daily as needed for spasms. Patient not taking: Reported on 04/25/2016 09/18/14   [name], MD  hydrochlorothiazide (HYDRODIURIL) 12.5 MG tablet Take 1 tablet (12.5 mg total) by mouth daily. 04/25/16   Daleen Bo, MD  ranitidine (ZANTAC) 150 MG tablet Take 1 tablet (150 mg total) by mouth 2 (two) times daily. Patient not taking: Reported on 04/25/2016 07/12/14   Tori Milks, MD    Family History Family History  Problem Relation Age of Onset  . Cancer Mother   . Diabetes Mother   . Heart disease Mother   . Hyperlipidemia Mother   . Hypertension Mother     Social History Social History   Tobacco Use  . Smoking status: Never Smoker  . Smokeless tobacco: Never Used  Substance Use Topics  . Alcohol use: No  . Drug use: No     Allergies   Demerol [meperidine]; Morphine and related; and Penicillins   Review of Systems Review of Systems  Constitutional: Negative for chills and fever.  Skin: Positive for wound (Burn to R fingers).  Neurological: Negative for weakness  and numbness.    Physical Exam Updated Vital Signs BP (!) 162/90 (BP Location: Right Arm)   Pulse (!) 58   Temp 98.2 F (36.8 C) (Oral)   Resp 18   SpO2 97%   Physical Exam  Constitutional: She appears well-developed and well-nourished. No distress.  HENT:  Head: Normocephalic and atraumatic.  Eyes: Conjunctivae are normal. Right eye exhibits no discharge. Left eye exhibits no discharge.  Cardiovascular:  Pulses:      Radial pulses are 2+ on the right side, and 2+ on the left side.  Musculoskeletal:  RUE: Patient has ROM to the RUE digits including all DIP, PIP, and MCPs- there is some limititation with 2nd, 3rd, and 4th digit DIP/PIP flexion  secondary to pain, however patient is able to move the digit somewhat and almost make a fist. Patient has full ROM of the thumb. The 2nd, 3rd, and 4th digits are all diffusely tender to palpation.   Neurological: She is alert.  Clear speech. Sensation intact to sharp/dull touch to bilateral upper extremities. Unable to assess grip strength secondary to pain.   Skin: Capillary refill takes less than 2 seconds.  RUE: There is mild erythema to the palmar aspect of the R hand including the 1st-4th digits. There is an approximately 1.5 cm size blister to the skin overlying the 2nd distal phalanx with a smaller 0.5 cm blister occurring proximally on the 2nd middle phalanx. There is a 1 cm size blister to the lateral radial aspect of the middle phalanx of the 4th digit. There is diffuse swelling to digits 2-4. There is no overlying warmth. Skin is intact. No active drainage. No induration or fluctuance. Patient is NVI in all digits. Areas are pictured below.    Psychiatric: She has a normal mood and affect. Her behavior is normal. Thought content normal.  Nursing note and vitals reviewed.        ED Treatments / Results  Labs (all labs ordered are listed, but only abnormal results are displayed) Labs Reviewed - No data to display  EKG  EKG Interpretation None      Radiology No results found.  Procedures Procedures (including critical care time)  Medications Ordered in ED Medications - No data to display   Initial Impression / Assessment and Plan / ED Course  I have reviewed the triage vital signs and the nursing notes.  Pertinent labs & imaging results that were available during my care of the patient were reviewed by me and considered in my medical decision making (see chart for details).    Patient presents with complaint of burn to RUE digits with blister. She is nontoxic appearing, afebrile, noted to have elevated BP- no indication of HTN emergency- patient states she recently  stopped taking anti-hypertensive medication- discussed need for recheck by PCP. The blistered areas are intact. She has 2+ symmetric radial pulses with < 2 seconds capillary refill to all digits. Sensation is intact and patient is able to move all digits. Tetanus was within last 5 years.  Recommended patient keep the blisters intact as long as possible. Recommended Neosporin, especially once blisters open up. Applied dressing with abx ointment to burn area in the ED today. We discussed applying ice to affected areas and elevating the hand. Will prescribe Naproxen for pain and swelling, last creatinine WNL. Instructed plastic surgery follow up for re-evaluation. I discussed treatment plan, need for plastic surgery and PCP follow-up, and return precautions with the patient. Provided opportunity for questions, patient confirmed  understanding and is in agreement with plan.    Final Clinical Impressions(s) / ED Diagnoses   Final diagnoses:  Burn    ED Discharge Orders        Ordered    naproxen (NAPROSYN) 500 MG tablet  2 times daily     02/08/17 [address], [place], PA-C 02/08/17 1159    Hayden Rasmussen, MD 02/09/17 [phone]

## 2017-03-20 ENCOUNTER — Ambulatory Visit: Payer: Self-pay | Admitting: Podiatry

## 2017-03-28 ENCOUNTER — Ambulatory Visit: Payer: Self-pay | Admitting: Podiatry

## 2017-04-01 ENCOUNTER — Ambulatory Visit: Payer: Self-pay | Admitting: Podiatry

## 2017-04-04 ENCOUNTER — Telehealth: Payer: Self-pay | Admitting: *Deleted

## 2017-04-04 NOTE — Telephone Encounter (Signed)
Patient has no showed several appointments. Per Dr. Caffie Pinto we will no longer see her as a patient. Informed patient of this decision. Patient voiced understanding.

## 2017-07-19 ENCOUNTER — Other Ambulatory Visit: Payer: Self-pay | Admitting: Family Medicine

## 2017-07-19 DIAGNOSIS — Z1231 Encounter for screening mammogram for malignant neoplasm of breast: Secondary | ICD-10-CM

## 2017-08-09 ENCOUNTER — Ambulatory Visit: Payer: BLUE CROSS/BLUE SHIELD

## 2017-09-08 ENCOUNTER — Emergency Department (INDEPENDENT_AMBULATORY_CARE_PROVIDER_SITE_OTHER)
Admission: EM | Admit: 2017-09-08 | Discharge: 2017-09-08 | Disposition: A | Discharge status: Home / Self Care | Payer: Medicare Other | Source: Home / Self Care | Attending: Family Medicine | Admitting: Family Medicine

## 2017-09-08 ENCOUNTER — Other Ambulatory Visit: Payer: Self-pay

## 2017-09-08 ENCOUNTER — Encounter: Payer: Self-pay | Admitting: Emergency Medicine

## 2017-09-08 ENCOUNTER — Emergency Department (INDEPENDENT_AMBULATORY_CARE_PROVIDER_SITE_OTHER): Payer: Medicare Other

## 2017-09-08 DIAGNOSIS — M79672 Pain in left foot: Secondary | ICD-10-CM | POA: Diagnosis not present

## 2017-09-08 DIAGNOSIS — S76012A Strain of muscle, fascia and tendon of left hip, initial encounter: Secondary | ICD-10-CM

## 2017-09-08 DIAGNOSIS — R1032 Left lower quadrant pain: Secondary | ICD-10-CM | POA: Diagnosis not present

## 2017-09-08 LAB — POCT URINALYSIS DIP (MANUAL ENTRY)
BILIRUBIN UA: NEGATIVE mg/dL
Bilirubin, UA: NEGATIVE
Blood, UA: NEGATIVE
GLUCOSE UA: NEGATIVE mg/dL
Nitrite, UA: NEGATIVE
SPEC GRAV UA: 1.025 (ref 1.010–1.025)
Urobilinogen, UA: 0.2 E.U./dL
pH, UA: 6 (ref 5.0–8.0)

## 2017-09-08 MED ORDER — MELOXICAM 15 MG PO TABS: 15.0000 mg | ORAL_TABLET | Freq: Every day | ORAL | 0 refills | Status: DC

## 2017-09-08 MED ORDER — HYDROCODONE-ACETAMINOPHEN 5-325 MG PO TABS: ORAL_TABLET | ORAL | 0 refills | Status: DC

## 2017-09-08 MED ORDER — GABAPENTIN 100 MG PO CAPS: ORAL_CAPSULE | ORAL | 1 refills | Status: DC

## 2017-09-08 NOTE — ED Triage Notes (Signed)
Patient has noticed pain in her left side when twisting motion over past 3 days; also concerned about numbness/burning in left large toe which had surgery about 7 years ago.

## 2017-09-08 NOTE — Discharge Instructions (Addendum)
Begin stretching and range of motion exercises as tolerated.

## 2017-09-08 NOTE — ED Provider Notes (Signed)
[facility]    CSN: [id number] Arrival date & time: 09/08/17  1227     History   Chief Complaint Chief Complaint  Patient presents with  . Flank Pain  . Toe Pain    HPI Kelly Rosales is a 67 y.o. female.   Patient reports that she developed sharp pain in her left side and inguinal area 3 days ago while reaching up to her left to pick up a box.  Yesterday while reaching over her left shoulder in her car to retrieve an item from the back seat, she again felt pain in her left side. She also complains of pain in the dorsum of her left foot that has gradually increased since she had hallux valgus repair about 7 years ago.  She recalls no recent injury.  She admits that has difficulty walking as a result of her left foot pain.  During the past year she has developed recurrent numbness and tingling in her left first and second toes that keeps her awake at night.  She denies history of diabetes.  The history is provided by the patient.    Past Medical History:  Diagnosis Date  . Achilles tendonitis 10/2010  . Ankle deformity 10/2010  . Chronic abdominal pain   . Chronic back pain   . Chronic leg pain    left  . Chronic neck pain   . Fibromyalgia   . Hypertension   . IBS (irritable bowel syndrome)   . Lumbar radiculopathy 10/2010  . Peripheral neuropathy 10/2010   Secondary to Lumbar Radiculopathy    Patient Active Problem List   Diagnosis Date Noted  . Uterine fibroid 09/18/2014  . HYPERLIPIDEMIA 02/10/2009  . IRRITABLE BOWEL SYNDROME 02/10/2009  . COLONIC POLYPS, HYPERPLASTIC, HX OF 02/10/2009  . ABDOMINAL PAIN, GENERALIZED 12/07/2008    Past Surgical History:  Procedure Laterality Date  . [place] Bunionectomy Left 2003  . Cotton Osteotomy w/ graft Left 04/10/12  . FOOT SURGERY    . Nail Matrixectomy Left 04/10/12  . TAL Lengthening Left 04/10/2012    OB History   None      Home Medications    Prior to Admission medications   Medication Sig  Start Date End Date Taking? Authorizing Provider  albuterol (PROVENTIL HFA;VENTOLIN HFA) 108 (90 BASE) MCG/ACT inhaler Inhale 2 puffs into the lungs every 6 (six) hours as needed for wheezing or shortness of breath.    [provider]  gabapentin (NEURONTIN) 100 MG capsule Take one cap by mouth at bedtime. 09/08/17   Kandra Nicolas, MD  hydrochlorothiazide (HYDRODIURIL) 12.5 MG tablet Take 1 tablet (12.5 mg total) by mouth daily. 04/25/16   Daleen Bo, MD  HYDROcodone-acetaminophen (NORCO/VICODIN) 5-325 MG tablet Take one by mouth at bedtime as needed for pain.  May repeat in 4 to 6 hours. 09/08/17   Kandra Nicolas, MD  meloxicam (MOBIC) 15 MG tablet Take 1 tablet (15 mg total) by mouth daily. Take with food each morning 09/08/17   Kandra Nicolas, MD  naproxen (NAPROSYN) 500 MG tablet Take 1 tablet (500 mg total) by mouth 2 (two) times daily. 02/08/17   Petrucelli, Glynda Jaeger, PA-C    Family History Family History  Problem Relation Age of Onset  . Cancer Mother   . Diabetes Mother   . Heart disease Mother   . Hyperlipidemia Mother   . Hypertension Mother     Social History Social History   Tobacco Use  . Smoking status: Never  Smoker  . Smokeless tobacco: Never Used  Substance Use Topics  . Alcohol use: No  . Drug use: No     Allergies   Demerol [meperidine]; Morphine and related; and Penicillins   Review of Systems Review of Systems  Constitutional: Negative for activity change, chills, diaphoresis, fatigue and fever.  HENT: Negative.   Eyes: Negative.   Respiratory: Negative.   Cardiovascular: Negative.   Gastrointestinal: Negative.   Genitourinary: Negative.   Musculoskeletal:       Pain left inguinal area, and left foot pain.  Skin: Negative.      Physical Exam Triage Vital Signs ED Triage Vitals  Enc Vitals Group     BP 09/08/17 1313 137/82     Pulse Rate 09/08/17 1313 61     Resp 09/08/17 1313 18     Temp 09/08/17 1313 98.8 F (37.1 C)      Temp Source 09/08/17 1313 Oral     SpO2 09/08/17 1313 95 %     Weight 09/08/17 1314 204 lb (92.5 kg)     Height 09/08/17 1314 5\' 7"  (1.702 m)     Head Circumference --      Peak Flow --      Pain Score 09/08/17 1314 2     Pain Loc --      Pain Edu? --      Excl. in [place]? --    No data found.  Updated Vital Signs BP 137/82 (BP Location: Right Arm)   Pulse 61   Temp 98.8 F (37.1 C) (Oral)   Resp 18   Ht 5\' 7"  (1.702 m)   Wt 92.5 kg   SpO2 95%   BMI 31.95 kg/m   Visual Acuity Right Eye Distance:   Left Eye Distance:   Bilateral Distance:    Right Eye Near:   Left Eye Near:    Bilateral Near:     Physical Exam  Constitutional: She appears well-developed and well-nourished. No distress.  HENT:  Head: Normocephalic.  Eyes: Pupils are equal, round, and reactive to light. Conjunctivae are normal.  Neck: Normal range of motion.  Cardiovascular: Normal heart sounds.  Pulmonary/Chest: Breath sounds normal.  Abdominal: Bowel sounds are normal. She exhibits no distension and no mass. There is no tenderness.  Musculoskeletal: She exhibits no edema.       Left hip: She exhibits decreased range of motion and tenderness.       Legs:      Feet:  Left dorsal mid foot has tender bony prominence.  No overlying erythema or warmth  There is tenderness over the left hip flexors and inguinal region, worse with resisted left hip flexion.  Neurological: She is alert.  Skin: Skin is warm and dry. No rash noted.  Nursing note and vitals reviewed.    UC Treatments / Results  Labs (all labs ordered are listed, but only abnormal results are displayed) Labs Reviewed  POCT URINALYSIS DIP (MANUAL ENTRY) - Abnormal; Notable for the following components:      Result Value   Color, UA light yellow (*)    Clarity, UA cloudy (*)    Protein Ur, POC trace (*)    Leukocytes, UA Trace (*)    All other components within normal limits  URINE CULTURE    EKG None  Radiology Dg Foot Complete  Left  Result Date: 09/08/2017 CLINICAL DATA:  Medial left foot pain for 6 months. No known injury. EXAM: LEFT FOOT - COMPLETE 3+ VIEW COMPARISON:  Plain films left foot 05/17/2012. FINDINGS: No acute bony or joint abnormality is identified. The patient is status post hallux valgus repair. No evidence of arthropathy. Soft tissues are unremarkable. IMPRESSION: No acute abnormality or change compared the prior study. Status post hallux valgus repair. Electronically Signed   By: Inge Rise M.D.   On: 09/08/2017 13:57    Procedures Procedures (including critical care time)  Medications Ordered in UC Medications - No data to display  Initial Impression / Assessment and Plan / UC Course  I have reviewed the triage vital signs and the nursing notes.  Pertinent labs & imaging results that were available during my care of the patient were reviewed by me and considered in my medical decision making (see chart for details).    Patient has history of left hallux valgus repair with consequent abnormal gait and persistent foot pain, now with neuropathic type pain in her left foot, worse at night.  Suspect her left hip flexor pain may be indirectly related to abnormal gait. Will begin cautious trial of low dose gabapentin at bedtime.  Begin Meloxicam 15mg  daily. Rx for Lortab at bedtime (#10, no refill).  Controlled Substance Prescriptions I have consulted the  Controlled Substances Registry for this patient, and feel the risk/benefit ratio today is favorable for proceeding with this prescription for a controlled substance.   Patient may benefit from orthotics and/or PT.  Will refer to Dr. Aundria Mems or Dr. Lynne Leader ([facility]) for evaluation.   Final Clinical Impressions(s) / UC Diagnoses   Final diagnoses:  Left foot pain  Strain of hip flexor, left, initial encounter     Discharge Instructions     Begin stretching and range of motion exercises as  tolerated.    ED Prescriptions    Medication Sig Dispense Auth. Provider   gabapentin (NEURONTIN) 100 MG capsule Take one cap by mouth at bedtime. 15 capsule Kandra Nicolas, MD   meloxicam (MOBIC) 15 MG tablet Take 1 tablet (15 mg total) by mouth daily. Take with food each morning 30 tablet Kandra Nicolas, MD   HYDROcodone-acetaminophen (NORCO/VICODIN) 5-325 MG tablet Take one by mouth at bedtime as needed for pain.  May repeat in 4 to 6 hours. 10 tablet Kandra Nicolas, MD        Kandra Nicolas, MD 09/09/17 2116

## 2017-09-10 ENCOUNTER — Telehealth: Payer: Self-pay

## 2017-09-10 LAB — URINE CULTURE
MICRO NUMBER:: 91048155
SPECIMEN QUALITY: ADEQUATE

## 2017-09-10 NOTE — Telephone Encounter (Signed)
Left message on VM with call back information if any questions or concerns.

## 2017-10-17 DIAGNOSIS — I10 Essential (primary) hypertension: Secondary | ICD-10-CM | POA: Diagnosis not present

## 2017-10-17 DIAGNOSIS — Z6833 Body mass index (BMI) 33.0-33.9, adult: Secondary | ICD-10-CM | POA: Diagnosis not present

## 2017-10-17 DIAGNOSIS — E6609 Other obesity due to excess calories: Secondary | ICD-10-CM | POA: Diagnosis not present

## 2017-10-17 DIAGNOSIS — F329 Major depressive disorder, single episode, unspecified: Secondary | ICD-10-CM | POA: Diagnosis not present

## 2017-10-17 DIAGNOSIS — R7303 Prediabetes: Secondary | ICD-10-CM | POA: Diagnosis not present

## 2017-11-18 DIAGNOSIS — R112 Nausea with vomiting, unspecified: Secondary | ICD-10-CM | POA: Diagnosis not present

## 2017-11-18 DIAGNOSIS — R1032 Left lower quadrant pain: Secondary | ICD-10-CM | POA: Diagnosis not present

## 2017-11-18 DIAGNOSIS — F419 Anxiety disorder, unspecified: Secondary | ICD-10-CM | POA: Diagnosis not present

## 2017-11-18 DIAGNOSIS — R1013 Epigastric pain: Secondary | ICD-10-CM | POA: Diagnosis not present

## 2017-12-10 DIAGNOSIS — D123 Benign neoplasm of transverse colon: Secondary | ICD-10-CM | POA: Diagnosis not present

## 2017-12-10 DIAGNOSIS — D122 Benign neoplasm of ascending colon: Secondary | ICD-10-CM | POA: Diagnosis not present

## 2017-12-10 DIAGNOSIS — K635 Polyp of colon: Secondary | ICD-10-CM | POA: Diagnosis not present

## 2017-12-10 DIAGNOSIS — R1084 Generalized abdominal pain: Secondary | ICD-10-CM | POA: Diagnosis not present

## 2017-12-10 DIAGNOSIS — D127 Benign neoplasm of rectosigmoid junction: Secondary | ICD-10-CM | POA: Diagnosis not present

## 2018-01-06 DIAGNOSIS — K6289 Other specified diseases of anus and rectum: Secondary | ICD-10-CM | POA: Diagnosis not present

## 2018-01-06 DIAGNOSIS — M549 Dorsalgia, unspecified: Secondary | ICD-10-CM | POA: Diagnosis not present

## 2018-01-17 DIAGNOSIS — I10 Essential (primary) hypertension: Secondary | ICD-10-CM | POA: Insufficient documentation

## 2018-01-17 DIAGNOSIS — Z6833 Body mass index (BMI) 33.0-33.9, adult: Secondary | ICD-10-CM | POA: Insufficient documentation

## 2018-01-17 DIAGNOSIS — E6609 Other obesity due to excess calories: Secondary | ICD-10-CM | POA: Insufficient documentation

## 2018-04-10 ENCOUNTER — Emergency Department (INDEPENDENT_AMBULATORY_CARE_PROVIDER_SITE_OTHER)
Admission: EM | Admit: 2018-04-10 | Discharge: 2018-04-10 | Disposition: A | Discharge status: Home / Self Care | Payer: Medicare Other | Source: Home / Self Care | Attending: Family Medicine | Admitting: Family Medicine

## 2018-04-10 ENCOUNTER — Other Ambulatory Visit: Payer: Self-pay

## 2018-04-10 DIAGNOSIS — L299 Pruritus, unspecified: Secondary | ICD-10-CM | POA: Diagnosis not present

## 2018-04-10 MED ORDER — PREDNISONE 20 MG PO TABS: ORAL_TABLET | ORAL | 0 refills | Status: DC

## 2018-04-10 NOTE — ED Provider Notes (Signed)
[name]    CSN: [id number] Arrival date & time: 04/10/18  1654     History   Chief Complaint Chief Complaint  Patient presents with  . Pruritis    HPI Kelly Rosales is a 68 y.o. female.   Patient complains of onset of generalized itching three days ago without rash.  She feels well otherwise.  No known contact with allergens.  No new foods or medications.  No history of seasonal rhinitis/asthma.  She feels well otherwise.  The history is provided by the patient.    Past Medical History:  Diagnosis Date  . Achilles tendonitis 10/2010  . Ankle deformity 10/2010  . Chronic abdominal pain   . Chronic back pain   . Chronic leg pain    left  . Chronic neck pain   . Fibromyalgia   . Hypertension   . IBS (irritable bowel syndrome)   . Lumbar radiculopathy 10/2010  . Peripheral neuropathy 10/2010   Secondary to Lumbar Radiculopathy    Patient Active Problem List   Diagnosis Date Noted  . Uterine fibroid 09/18/2014  . HYPERLIPIDEMIA 02/10/2009  . IRRITABLE BOWEL SYNDROME 02/10/2009  . COLONIC POLYPS, HYPERPLASTIC, HX OF 02/10/2009  . ABDOMINAL PAIN, GENERALIZED 12/07/2008    Past Surgical History:  Procedure Laterality Date  . [place] Bunionectomy Left 2003  . Cotton Osteotomy w/ graft Left 04/10/12  . FOOT SURGERY    . Nail Matrixectomy Left 04/10/12  . TAL Lengthening Left 04/10/2012    OB History   No obstetric history on file.      Home Medications    Prior to Admission medications   Medication Sig Start Date End Date Taking? Authorizing Provider  albuterol (PROVENTIL HFA;VENTOLIN HFA) 108 (90 BASE) MCG/ACT inhaler Inhale 2 puffs into the lungs every 6 (six) hours as needed for wheezing or shortness of breath.    [provider]  gabapentin (NEURONTIN) 100 MG capsule Take one cap by mouth at bedtime. 09/08/17   Kandra Nicolas, MD  hydrochlorothiazide (HYDRODIURIL) 12.5 MG tablet Take 1 tablet (12.5 mg total) by mouth daily.  04/25/16   Daleen Bo, MD  HYDROcodone-acetaminophen (NORCO/VICODIN) 5-325 MG tablet Take one by mouth at bedtime as needed for pain.  May repeat in 4 to 6 hours. 09/08/17   Kandra Nicolas, MD  meloxicam (MOBIC) 15 MG tablet Take 1 tablet (15 mg total) by mouth daily. Take with food each morning 09/08/17   Kandra Nicolas, MD  naproxen (NAPROSYN) 500 MG tablet Take 1 tablet (500 mg total) by mouth 2 (two) times daily. 02/08/17   Petrucelli, Samantha R, PA-C  predniSONE (DELTASONE) 20 MG tablet Take one tab by mouth twice daily for 4 days, then one daily. Take with food. 04/10/18   Kandra Nicolas, MD    Family History Family History  Problem Relation Age of Onset  . Cancer Mother   . Diabetes Mother   . Heart disease Mother   . Hyperlipidemia Mother   . Hypertension Mother     Social History Social History   Tobacco Use  . Smoking status: Never Smoker  . Smokeless tobacco: Never Used  Substance Use Topics  . Alcohol use: No  . Drug use: No     Allergies   Demerol [meperidine]; Morphine and related; and Penicillins   Review of Systems Review of Systems  Constitutional: Negative for activity change, appetite change, chills, diaphoresis, fatigue, fever and unexpected weight change.  HENT: Negative.   Eyes:  Negative.   Respiratory: Negative.   Cardiovascular: Negative.   Gastrointestinal: Negative.   Genitourinary: Negative.   Musculoskeletal: Negative.   Skin: Negative for color change and rash.  Neurological: Negative.      Physical Exam Triage Vital Signs ED Triage Vitals  Enc Vitals Group     BP 04/10/18 1736 113/75     Pulse Rate 04/10/18 1736 (!) 106     Resp 04/10/18 1736 18     Temp 04/10/18 1736 98.5 F (36.9 C)     Temp Source 04/10/18 1736 Oral     SpO2 04/10/18 1736 97 %     Weight --      Height --      Head Circumference --      Peak Flow --      Pain Score 04/10/18 1737 0     Pain Loc --      Pain Edu? --      Excl. in [place]? --    No data  found.  Updated Vital Signs BP 113/75 (BP Location: Right Arm)   Pulse (!) 106   Temp 98.5 F (36.9 C) (Oral)   Resp 18   SpO2 97%   Visual Acuity Right Eye Distance:   Left Eye Distance:   Bilateral Distance:    Right Eye Near:   Left Eye Near:    Bilateral Near:     Physical Exam Nursing notes and Vital Signs reviewed. Appearance:  Patient appears stated age, and in no acute distress.    Eyes:  Pupils are equal, round, and reactive to light and accomodation.  Extraocular movement is intact.  Conjunctivae are not inflamed   Pharynx:  Normal; moist mucous membranes  Neck:  Supple.  No adenopathy Lungs:  Clear to auscultation.  Breath sounds are equal.  Moving air well. Heart:  Regular rate and rhythm without murmurs, rubs, or gallops.  Abdomen:  Nontender without masses or hepatosplenomegaly.  Bowel sounds are present.  No CVA or flank tenderness.  Extremities:  No edema.  Skin:  No rash present.     UC Treatments / Results  Labs (all labs ordered are listed, but only abnormal results are displayed) Labs Reviewed - No data to display  EKG None  Radiology No results found.  Procedures Procedures (including critical care time)  Medications Ordered in UC Medications - No data to display  Initial Impression / Assessment and Plan / UC Course  I have reviewed the triage vital signs and the nursing notes.  Pertinent labs & imaging results that were available during my care of the patient were reviewed by me and considered in my medical decision making (see chart for details).    Etiology of pruritus not obvious Begin prednisone burst/taper. Followup with Dermatologist if not improved 7 to 10 days.   Final Clinical Impressions(s) / UC Diagnoses   Final diagnoses:  Pruritus     Discharge Instructions     May continue Benadryl as needed for itching. Discussed proper skin care:  Minimize baths/showers.  Use a mild bath soap containing oil such as unscented  Dove.  Apply a moisturizing cream or lotion immediately after bathing while still wet, then towel dry.     ED Prescriptions    Medication Sig Dispense Auth. Provider   predniSONE (DELTASONE) 20 MG tablet Take one tab by mouth twice daily for 4 days, then one daily. Take with food. 12 tablet Kandra Nicolas, MD  Kandra Nicolas, MD 04/10/18 [name]

## 2018-04-10 NOTE — Discharge Instructions (Addendum)
May continue Benadryl as needed for itching. Discussed proper skin care:  Minimize baths/showers.  Use a mild bath soap containing oil such as unscented Dove.  Apply a moisturizing cream or lotion immediately after bathing while still wet, then towel dry.

## 2018-04-10 NOTE — ED Triage Notes (Signed)
Pt c/o itching all over body x 3 days. Has tried benedryl with no relief. Keeps her up all night.

## 2018-04-18 ENCOUNTER — Emergency Department (INDEPENDENT_AMBULATORY_CARE_PROVIDER_SITE_OTHER)
Admission: EM | Admit: 2018-04-18 | Discharge: 2018-04-18 | Disposition: A | Discharge status: Home / Self Care | Payer: Medicare Other | Source: Home / Self Care | Attending: Family Medicine | Admitting: Family Medicine

## 2018-04-18 ENCOUNTER — Other Ambulatory Visit: Payer: Self-pay

## 2018-04-18 ENCOUNTER — Encounter: Payer: Self-pay | Admitting: Emergency Medicine

## 2018-04-18 DIAGNOSIS — R300 Dysuria: Secondary | ICD-10-CM | POA: Diagnosis not present

## 2018-04-18 LAB — POCT URINALYSIS DIP (MANUAL ENTRY)
Glucose, UA: NEGATIVE mg/dL
Leukocytes, UA: NEGATIVE
Nitrite, UA: NEGATIVE
Protein Ur, POC: 30 mg/dL — AB
Spec Grav, UA: 1.025 (ref 1.010–1.025)
Urobilinogen, UA: 0.2 E.U./dL
pH, UA: 5.5 (ref 5.0–8.0)

## 2018-04-18 MED ORDER — CIPROFLOXACIN HCL 500 MG PO TABS: 500.0000 mg | ORAL_TABLET | Freq: Two times a day (BID) | ORAL | 0 refills | Status: DC

## 2018-04-18 MED ORDER — HYDROCODONE-ACETAMINOPHEN 5-325 MG PO TABS: 1.0000 | ORAL_TABLET | Freq: Four times a day (QID) | ORAL | 0 refills | Status: DC | PRN

## 2018-04-18 MED ORDER — ONDANSETRON 4 MG PO TBDP: ORAL_TABLET | ORAL | 0 refills | Status: DC

## 2018-04-18 NOTE — ED Provider Notes (Signed)
[facility]    CSN: [id number] Arrival date & time: 04/18/18  1459     History   Chief Complaint Chief Complaint  Patient presents with  . Back Pain    HPI Kelly Rosales is a 68 y.o. female.   Patient reports that she noted hematuria 2 days ago, followed by left lower back ache, hesitancy, nocturia, and sweats/chills.  She denies abdominal pain.  She has had mild nausea without vomiting.  The history is provided by the patient.  Urinary Frequency  This is a new problem. The current episode started 2 days ago. The problem occurs constantly. The problem has not changed since onset.Pertinent negatives include no chest pain, no abdominal pain and no headaches. Associated symptoms comments: Lower back pain. Nothing aggravates the symptoms. Nothing relieves the symptoms. She has tried nothing for the symptoms.    Past Medical History:  Diagnosis Date  . Achilles tendonitis 10/2010  . Ankle deformity 10/2010  . Chronic abdominal pain   . Chronic back pain   . Chronic leg pain    left  . Chronic neck pain   . Fibromyalgia   . Hypertension   . IBS (irritable bowel syndrome)   . Lumbar radiculopathy 10/2010  . Peripheral neuropathy 10/2010   Secondary to Lumbar Radiculopathy    Patient Active Problem List   Diagnosis Date Noted  . Uterine fibroid 09/18/2014  . HYPERLIPIDEMIA 02/10/2009  . IRRITABLE BOWEL SYNDROME 02/10/2009  . COLONIC POLYPS, HYPERPLASTIC, HX OF 02/10/2009  . ABDOMINAL PAIN, GENERALIZED 12/07/2008    Past Surgical History:  Procedure Laterality Date  . [place] Bunionectomy Left 2003  . Cotton Osteotomy w/ graft Left 04/10/12  . FOOT SURGERY    . Nail Matrixectomy Left 04/10/12  . TAL Lengthening Left 04/10/2012    OB History   No obstetric history on file.      Home Medications    Prior to Admission medications   Medication Sig Start Date End Date Taking? Authorizing Provider  albuterol (PROVENTIL HFA;VENTOLIN HFA) 108 (90 BASE)  MCG/ACT inhaler Inhale 2 puffs into the lungs every 6 (six) hours as needed for wheezing or shortness of breath.    [provider]  ciprofloxacin (CIPRO) 500 MG tablet Take 1 tablet (500 mg total) by mouth 2 (two) times daily. 04/18/18   Kandra Nicolas, MD  gabapentin (NEURONTIN) 100 MG capsule Take one cap by mouth at bedtime. 09/08/17   Kandra Nicolas, MD  hydrochlorothiazide (HYDRODIURIL) 12.5 MG tablet Take 1 tablet (12.5 mg total) by mouth daily. 04/25/16   Daleen Bo, MD  HYDROcodone-acetaminophen (NORCO/VICODIN) 5-325 MG tablet Take 1 tablet by mouth every 6 (six) hours as needed for moderate pain. 04/18/18   Kandra Nicolas, MD  meloxicam (MOBIC) 15 MG tablet Take 1 tablet (15 mg total) by mouth daily. Take with food each morning 09/08/17   Kandra Nicolas, MD  naproxen (NAPROSYN) 500 MG tablet Take 1 tablet (500 mg total) by mouth 2 (two) times daily. 02/08/17   Petrucelli, Samantha R, PA-C  ondansetron (ZOFRAN ODT) 4 MG disintegrating tablet Take one tab by mouth Q6hr prn nausea.  Dissolve under tongue. 04/18/18   Kandra Nicolas, MD  predniSONE (DELTASONE) 20 MG tablet Take one tab by mouth twice daily for 4 days, then one daily. Take with food. 04/10/18   Kandra Nicolas, MD    Family History Family History  Problem Relation Age of Onset  . Cancer Mother   .  Diabetes Mother   . Heart disease Mother   . Hyperlipidemia Mother   . Hypertension Mother     Social History Social History   Tobacco Use  . Smoking status: Never Smoker  . Smokeless tobacco: Never Used  Substance Use Topics  . Alcohol use: No  . Drug use: No     Allergies   Demerol [meperidine]; Morphine and related; and Penicillins   Review of Systems Review of Systems  Constitutional: Positive for activity change, appetite change, chills, diaphoresis and fatigue.  HENT: Negative.   Eyes: Negative.   Respiratory: Negative.   Cardiovascular: Negative for chest pain, palpitations and leg swelling.   Gastrointestinal: Negative for abdominal pain.  Endocrine: Negative for polydipsia.  Genitourinary: Positive for dysuria, flank pain, frequency and hematuria. Negative for decreased urine volume, difficulty urinating, pelvic pain and urgency.  Musculoskeletal: Negative for arthralgias.  Skin: Negative.   Neurological: Negative for headaches.  Hematological: Negative.      Physical Exam Triage Vital Signs ED Triage Vitals  Enc Vitals Group     BP 04/18/18 1537 135/85     Pulse Rate 04/18/18 1537 90     Resp --      Temp 04/18/18 1537 98.3 F (36.8 C)     Temp Source 04/18/18 1537 Oral     SpO2 04/18/18 1537 96 %     Weight 04/18/18 1538 200 lb (90.7 kg)     Height --      Head Circumference --      Peak Flow --      Pain Score 04/18/18 1538 4     Pain Loc --      Pain Edu? --      Excl. in [place]? --    No data found.  Updated Vital Signs BP 135/85 (BP Location: Right Arm)   Pulse 90   Temp 98.3 F (36.8 C) (Oral)   Wt 90.7 kg   SpO2 96%   BMI 31.32 kg/m   Visual Acuity Right Eye Distance:   Left Eye Distance:   Bilateral Distance:    Right Eye Near:   Left Eye Near:    Bilateral Near:     Physical Exam Nursing notes and Vital Signs reviewed. Appearance:  Patient appears stated age, and in no acute distress.    Eyes:  Pupils are equal, round, and reactive to light and accomodation.  Extraocular movement is intact.  Conjunctivae are not inflamed   Pharynx:  Normal; moist mucous membranes  Neck:  Supple.  No adenopathy Lungs:  Clear to auscultation.  Breath sounds are equal.  Moving air well. Heart:  Regular rate and rhythm without murmurs, rubs, or gallops.  Abdomen:  Nontender without masses or hepatosplenomegaly.  Bowel sounds are present.  Vague tenderness to palpation left CVA area. Extremities:  No edema.  Skin:  No rash present.     UC Treatments / Results  Labs (all labs ordered are listed, but only abnormal results are displayed) Labs Reviewed   POCT URINALYSIS DIP (MANUAL ENTRY) - Abnormal; Notable for the following components:      Result Value   Clarity, UA cloudy (*)    Bilirubin, UA small (*)    Ketones, POC UA trace (5) (*)    Blood, UA trace-lysed (*)    Protein Ur, POC =30 (*)    All other components within normal limits  URINE CULTURE    EKG None  Radiology No results found.  Procedures Procedures (including critical  care time)  Medications Ordered in UC Medications - No data to display  Initial Impression / Assessment and Plan / UC Course  I have reviewed the triage vital signs and the nursing notes.  Pertinent labs & imaging results that were available during my care of the patient were reviewed by me and considered in my medical decision making (see chart for details).    Urine culture pending.  Begin Cipro.  Rx for Zofran for nausea. Rx for Lortab. Followup with Family Doctor if not improved in one week.    Final Clinical Impressions(s) / UC Diagnoses   Final diagnoses:  Dysuria     Discharge Instructions     Increase fluid intake. If symptoms become significantly worse during the night or over the weekend, proceed to the local emergency room.     ED Prescriptions    Medication Sig Dispense Auth. Provider   ciprofloxacin (CIPRO) 500 MG tablet Take 1 tablet (500 mg total) by mouth 2 (two) times daily. 14 tablet Kandra Nicolas, MD   HYDROcodone-acetaminophen (NORCO/VICODIN) 5-325 MG tablet Take 1 tablet by mouth every 6 (six) hours as needed for moderate pain. 12 tablet Kandra Nicolas, MD   ondansetron (ZOFRAN ODT) 4 MG disintegrating tablet Take one tab by mouth Q6hr prn nausea.  Dissolve under tongue. 12 tablet Kandra Nicolas, MD        Kandra Nicolas, MD 04/27/18 2011

## 2018-04-18 NOTE — ED Triage Notes (Signed)
Pt c/o back pain, hematuria and frequency x2 days.

## 2018-04-18 NOTE — Discharge Instructions (Addendum)
Increase fluid intake. °If symptoms become significantly worse during the night or over the weekend, proceed to the local emergency room.  °

## 2018-04-19 LAB — URINE CULTURE
MICRO NUMBER:: 388875
SPECIMEN QUALITY:: ADEQUATE

## 2018-04-21 ENCOUNTER — Telehealth: Payer: Self-pay

## 2018-04-21 NOTE — Telephone Encounter (Signed)
Attempted to call, mail box full. 

## 2018-04-22 NOTE — Telephone Encounter (Signed)
Spoke with patient, given lab results.  Will follow up as needed.

## 2018-07-08 DIAGNOSIS — E119 Type 2 diabetes mellitus without complications: Secondary | ICD-10-CM | POA: Insufficient documentation

## 2018-08-01 ENCOUNTER — Ambulatory Visit: Payer: Medicare Other | Admitting: Osteopathic Medicine

## 2019-07-14 ENCOUNTER — Emergency Department (INDEPENDENT_AMBULATORY_CARE_PROVIDER_SITE_OTHER): Payer: Medicare Other

## 2019-07-14 ENCOUNTER — Emergency Department (INDEPENDENT_AMBULATORY_CARE_PROVIDER_SITE_OTHER)
Admission: EM | Admit: 2019-07-14 | Discharge: 2019-07-14 | Disposition: A | Discharge status: Home / Self Care | Payer: Medicare Other | Source: Home / Self Care | Attending: Family Medicine | Admitting: Family Medicine

## 2019-07-14 ENCOUNTER — Encounter: Payer: Self-pay | Admitting: Emergency Medicine

## 2019-07-14 ENCOUNTER — Other Ambulatory Visit: Payer: Self-pay

## 2019-07-14 DIAGNOSIS — W19XXXA Unspecified fall, initial encounter: Secondary | ICD-10-CM | POA: Diagnosis not present

## 2019-07-14 DIAGNOSIS — M545 Low back pain, unspecified: Secondary | ICD-10-CM

## 2019-07-14 DIAGNOSIS — M25512 Pain in left shoulder: Secondary | ICD-10-CM

## 2019-07-14 DIAGNOSIS — G8911 Acute pain due to trauma: Secondary | ICD-10-CM

## 2019-07-14 DIAGNOSIS — W108XXA Fall (on) (from) other stairs and steps, initial encounter: Secondary | ICD-10-CM

## 2019-07-14 DIAGNOSIS — Y92009 Unspecified place in unspecified non-institutional (private) residence as the place of occurrence of the external cause: Secondary | ICD-10-CM

## 2019-07-14 MED ORDER — CYCLOBENZAPRINE HCL 10 MG PO TABS: 10.0000 mg | ORAL_TABLET | Freq: Every day | ORAL | 0 refills | Status: DC

## 2019-07-14 NOTE — Discharge Instructions (Addendum)
Wear sling.  Apply ice pack for 20 to 30 minutes, 3 to 4 times daily  Continue until pain and swelling decrease.  Begin Ibuprofen 200mg , 4 tabs every 8 hours with food.   Begin range of motion and stretching exercises as tolerated.

## 2019-07-14 NOTE — ED Provider Notes (Signed)
[name]    CSN: [id number] Arrival date & time: 07/14/19  1311      History   Chief Complaint Chief Complaint  Patient presents with  . Fall    HPI Kelly Rosales is a 69 y.o. female.   At about 12:30 last night patient fell down stair at home, injuring her left shoulder, left lower back/coccyx, and left foot.  She now has persistent low back pain and limited range of motion of her shoulder.  She denies neck/head injury and loss of consciousness.      The history is provided by the patient.    Past Medical History:  Diagnosis Date  . Achilles tendonitis 10/2010  . Ankle deformity 10/2010  . Chronic abdominal pain   . Chronic back pain   . Chronic leg pain    left  . Chronic neck pain   . Fibromyalgia   . Hypertension   . IBS (irritable bowel syndrome)   . Lumbar radiculopathy 10/2010  . Peripheral neuropathy 10/2010   Secondary to Lumbar Radiculopathy    Patient Active Problem List   Diagnosis Date Noted  . Uterine fibroid 09/18/2014  . HYPERLIPIDEMIA 02/10/2009  . IRRITABLE BOWEL SYNDROME 02/10/2009  . COLONIC POLYPS, HYPERPLASTIC, HX OF 02/10/2009  . ABDOMINAL PAIN, GENERALIZED 12/07/2008    Past Surgical History:  Procedure Laterality Date  . [place] Bunionectomy Left 2003  . Cotton Osteotomy w/ graft Left 04/10/12  . FOOT SURGERY    . Nail Matrixectomy Left 04/10/12  . TAL Lengthening Left 04/10/2012    OB History   No obstetric history on file.      Home Medications    Prior to Admission medications   Medication Sig Start Date End Date Taking? Authorizing Provider  cyclobenzaprine (FLEXERIL) 10 MG tablet Take 1 tablet (10 mg total) by mouth at bedtime. 07/14/19   Kandra Nicolas, MD    Family History Family History  Problem Relation Age of Onset  . Cancer Mother   . Diabetes Mother   . Heart disease Mother   . Hyperlipidemia Mother   . Hypertension Mother     Social History Social History   Tobacco Use  .  Smoking status: Never Smoker  . Smokeless tobacco: Never Used  Vaping Use  . Vaping Use: Never used  Substance Use Topics  . Alcohol use: No  . Drug use: No     Allergies   Demerol [meperidine], Morphine and related, and Penicillins   Review of Systems Review of Systems  Constitutional: Positive for activity change. Negative for fatigue and fever.  HENT: Negative.   Eyes: Negative.   Respiratory: Negative for chest tightness and shortness of breath.   Cardiovascular: Negative for chest pain.  Gastrointestinal: Negative.   Genitourinary: Negative.   Musculoskeletal: Positive for back pain. Negative for neck pain and neck stiffness.       Right shoulder pain and left foot pain.  Skin: Negative for color change and wound.  Neurological: Negative for dizziness, light-headedness, numbness and headaches.     Physical Exam Triage Vital Signs ED Triage Vitals  Enc Vitals Group     BP 07/14/19 1350 (!) 178/88     Pulse Rate 07/14/19 1350 (!) 54     Resp --      Temp 07/14/19 1350 99.1 F (37.3 C)     Temp Source 07/14/19 1350 Oral     SpO2 07/14/19 1350 97 %     Weight 07/14/19 1352 199  lb (90.3 kg)     Height 07/14/19 1352 5\' 6"  (1.676 m)     Head Circumference --      Peak Flow --      Pain Score 07/14/19 1351 8     Pain Loc --      Pain Edu? --      Excl. in [place]? --    No data found.  Updated Vital Signs BP (!) 178/88 (BP Location: Right Arm)   Pulse (!) 54   Temp 99.1 F (37.3 C) (Oral)   Ht 5\' 6"  (1.676 m)   Wt 90.3 kg   SpO2 97%   BMI 32.12 kg/m   Visual Acuity Right Eye Distance:   Left Eye Distance:   Bilateral Distance:    Right Eye Near:   Left Eye Near:    Bilateral Near:     Physical Exam Vitals and nursing note reviewed.  Constitutional:      General: She is not in acute distress. HENT:     Head: Atraumatic.     Right Ear: External ear normal.     Left Ear: External ear normal.     Nose: Nose normal.     Mouth/Throat:     Pharynx:  Oropharynx is clear.  Eyes:     Conjunctiva/sclera: Conjunctivae normal.     Pupils: Pupils are equal, round, and reactive to light.  Cardiovascular:     Rate and Rhythm: Bradycardia present.     Heart sounds: Normal heart sounds.  Pulmonary:     Breath sounds: Normal breath sounds.  Chest:     Chest wall: No tenderness.  Abdominal:     Tenderness: There is no abdominal tenderness.  Musculoskeletal:     Left shoulder: Swelling present. No deformity, tenderness, bony tenderness or crepitus. Decreased range of motion. Decreased strength. Normal pulse.     Cervical back: Normal range of motion. No tenderness.       Back:     Right lower leg: No edema.     Left lower leg: No edema.     Comments: Back:  Range of motion relatively well preserved.  Can heel/toe walk and squat without difficulty. Tenderness in the right paraspinous muscles from L3 to Sacral area.  Straight leg raising test is negative.  Sitting knee extension test is negative.  Strength and sensation in the lower extremities is normal.  Patellar and achilles reflexes are normal   Left shoulder has minimal tenderness to palpation.  However, she is unable to abduct left shoulder above horizontal.  Apley's test is positive.  Internal/external rotation and strength relatively good.  Her left foot has full range of motion and minimal tenderness to palpation.  Skin:    General: Skin is warm and dry.  Neurological:     Mental Status: She is alert and oriented to person, place, and time.      UC Treatments / Results  Labs (all labs ordered are listed, but only abnormal results are displayed) Labs Reviewed - No data to display  EKG   Radiology DG Lumbar Spine Complete  Result Date: 07/14/2019 CLINICAL DATA:  Fall down stairs last night landing on left shoulder and lower back. Lumbosacral and sacrococcygeal pain. EXAM: LUMBAR SPINE - COMPLETE 4+ VIEW COMPARISON:  Lumbar radiograph 09/07/2011 FINDINGS: Grade 1 anterolisthesis  of L3 on L4 is chronic and unchanged from prior exam, likely related to facet arthropathy. The alignment is otherwise normal. Vertebral body heights are preserved. No evidence of fracture.  Minor endplate spurring at multiple levels with disc space narrowing and T12-L1, L3-L4, and to a lesser extent L4-L5. Lower lumbar facet hypertrophy. The included sacrum and coccyx appear intact. IMPRESSION: No fracture or subluxation of the lumbar spine. Electronically Signed   By: Keith Rake M.D.   On: 07/14/2019 15:45   DG Shoulder Left  Result Date: 07/14/2019 CLINICAL DATA:  69 year old female with fall and trauma to the left shoulder. EXAM: LEFT SHOULDER - 2+ VIEW COMPARISON:  None. FINDINGS: There is no acute fracture or dislocation. The bones are mildly osteopenic. No significant arthritic changes. The soft tissues are unremarkable. IMPRESSION: Negative. Electronically Signed   By: Anner Crete M.D.   On: 07/14/2019 15:43    Procedures Procedures (including critical care time)  Medications Ordered in UC Medications - No data to display  Initial Impression / Assessment and Plan / UC Course  I have reviewed the triage vital signs and the nursing notes.  Pertinent labs & imaging results that were available during my care of the patient were reviewed by me and considered in my medical decision making (see chart for details).    Suspect left shoulder rotator cuff injury.  Dispensed sling. Rx for Flexeril at bedtime. Followup with Dr. Aundria Mems ([facility]) for further evaluation/management.   Final Clinical Impressions(s) / UC Diagnoses   Final diagnoses:  Fall in home, initial encounter  Acute left-sided low back pain without sciatica  Acute pain of left shoulder due to trauma     Discharge Instructions     Wear sling.  Apply ice pack for 20 to 30 minutes, 3 to 4 times daily  Continue until pain and swelling decrease.  Begin Ibuprofen 200mg , 4 tabs every 8 hours  with food.   Begin range of motion and stretching exercises as tolerated.    ED Prescriptions    Medication Sig Dispense Auth. Provider   cyclobenzaprine (FLEXERIL) 10 MG tablet Take 1 tablet (10 mg total) by mouth at bedtime. 12 tablet Kandra Nicolas, MD        Kandra Nicolas, MD 07/16/19 [phone]

## 2019-07-14 NOTE — ED Triage Notes (Signed)
Fell down stairs last night injuring LT shoulder, Coccyx, LT foot.

## 2019-07-19 ENCOUNTER — Telehealth: Payer: Self-pay | Admitting: Emergency Medicine

## 2019-07-19 MED ORDER — METHOCARBAMOL 500 MG PO TABS: 500.0000 mg | ORAL_TABLET | Freq: Two times a day (BID) | ORAL | 0 refills | Status: DC

## 2019-07-19 NOTE — Telephone Encounter (Signed)
Patient requests a different medicine  cyclobenzaprine 10mg  PO, her current one is not covered by insurance.

## 2019-07-19 NOTE — Telephone Encounter (Signed)
Changed from flexeril due to insurance coverage sent to [facility] on [address] in [place]

## 2020-01-27 ENCOUNTER — Emergency Department (INDEPENDENT_AMBULATORY_CARE_PROVIDER_SITE_OTHER)
Admission: EM | Admit: 2020-01-27 | Discharge: 2020-01-27 | Disposition: A | Discharge status: Home / Self Care | Payer: Self-pay | Source: Home / Self Care

## 2020-01-27 ENCOUNTER — Emergency Department (INDEPENDENT_AMBULATORY_CARE_PROVIDER_SITE_OTHER): Payer: Medicare Other

## 2020-01-27 ENCOUNTER — Other Ambulatory Visit: Payer: Self-pay

## 2020-01-27 DIAGNOSIS — W010XXA Fall on same level from slipping, tripping and stumbling without subsequent striking against object, initial encounter: Secondary | ICD-10-CM

## 2020-01-27 DIAGNOSIS — M545 Low back pain, unspecified: Secondary | ICD-10-CM

## 2020-01-27 DIAGNOSIS — M546 Pain in thoracic spine: Secondary | ICD-10-CM | POA: Diagnosis not present

## 2020-01-27 DIAGNOSIS — M542 Cervicalgia: Secondary | ICD-10-CM

## 2020-01-27 DIAGNOSIS — W19XXXA Unspecified fall, initial encounter: Secondary | ICD-10-CM

## 2020-01-27 DIAGNOSIS — M549 Dorsalgia, unspecified: Secondary | ICD-10-CM

## 2020-01-27 MED ORDER — PREDNISONE 20 MG PO TABS: 20.0000 mg | ORAL_TABLET | Freq: Every day | ORAL | 0 refills | Status: DC

## 2020-01-27 MED ORDER — CYCLOBENZAPRINE HCL 10 MG PO TABS: 10.0000 mg | ORAL_TABLET | Freq: Two times a day (BID) | ORAL | 0 refills | Status: DC | PRN

## 2020-01-27 NOTE — ED Provider Notes (Addendum)
[name]    CSN: [id number] Arrival date & time: 01/27/20  1728      History   Chief Complaint Chief Complaint  Patient presents with  . Back Pain    HPI Kelly Rosales is a 70 y.o. female.   HPI Patient is here for evaluation of worsening back pain related to a fall a a grocery store x 2 days ago. Patient reports generalized pain involving upper and lower back. Pain is exacerbated by walking and changes in position. She denies any prior back injuries or chronic pain involving the back. She has not taken any OTC medication for pain. She has applied analgesic rub to back without relief. Past Medical History:  Diagnosis Date  . Achilles tendonitis 10/2010  . Ankle deformity 10/2010  . Chronic abdominal pain   . Chronic back pain   . Chronic leg pain    left  . Chronic neck pain   . Fibromyalgia   . Hypertension   . IBS (irritable bowel syndrome)   . Lumbar radiculopathy 10/2010  . Peripheral neuropathy 10/2010   Secondary to Lumbar Radiculopathy    Patient Active Problem List   Diagnosis Date Noted  . Uterine fibroid 09/18/2014  . HYPERLIPIDEMIA 02/10/2009  . IRRITABLE BOWEL SYNDROME 02/10/2009  . COLONIC POLYPS, HYPERPLASTIC, HX OF 02/10/2009  . ABDOMINAL PAIN, GENERALIZED 12/07/2008    Past Surgical History:  Procedure Laterality Date  . [place] Bunionectomy Left 2003  . Cotton Osteotomy w/ graft Left 04/10/12  . FOOT SURGERY    . Nail Matrixectomy Left 04/10/12  . TAL Lengthening Left 04/10/2012    OB History   No obstetric history on file.      Home Medications    Prior to Admission medications   Medication Sig Start Date End Date Taking? Authorizing Provider  cyclobenzaprine (FLEXERIL) 10 MG tablet Take 1 tablet (10 mg total) by mouth at bedtime. 07/14/19   Kandra Nicolas, MD  methocarbamol (ROBAXIN) 500 MG tablet Take 1 tablet (500 mg total) by mouth 2 (two) times daily. 07/19/19   Noe Gens, PA-C    Family History Family  History  Problem Relation Age of Onset  . Cancer Mother   . Diabetes Mother   . Heart disease Mother   . Hyperlipidemia Mother   . Hypertension Mother     Social History Social History   Tobacco Use  . Smoking status: Never Smoker  . Smokeless tobacco: Never Used  Vaping Use  . Vaping Use: Never used  Substance Use Topics  . Alcohol use: No  . Drug use: No     Allergies   Demerol [meperidine], Morphine and related, and Penicillins   Review of Systems Review of Systems Pertinent negatives listed in HPI  Physical Exam Triage Vital Signs ED Triage Vitals [01/27/20 1802]  Enc Vitals Group     BP      Pulse      Resp      Temp      Temp src      SpO2      Weight      Height      Head Circumference      Peak Flow      Pain Score 9     Pain Loc      Pain Edu?      Excl. in [place]?    No data found.  Updated Vital Signs There were no vitals taken for this visit.  Visual  Acuity Right Eye Distance:   Left Eye Distance:   Bilateral Distance:    Right Eye Near:   Left Eye Near:    Bilateral Near:     Physical Exam Constitutional:      Appearance: She is obese.     Comments: Chronically ill appearing  Cardiovascular:     Rate and Rhythm: Normal rate and regular rhythm.  Pulmonary:     Effort: Pulmonary effort is normal.     Breath sounds: Normal breath sounds.  Musculoskeletal:        General: Normal range of motion.     Cervical back: Normal range of motion.  Skin:    General: Skin is warm.     Capillary Refill: Capillary refill takes less than 2 seconds.  Neurological:     General: No focal deficit present.     Mental Status: She is alert.     Gait: Gait normal.  Psychiatric:        Mood and Affect: Mood normal.        Behavior: Behavior normal.        Thought Content: Thought content normal.        Judgment: Judgment normal.    UC Treatments / Results  Labs (all labs ordered are listed, but only abnormal results are displayed) Labs  Reviewed - No data to display  EKG   Radiology DG Cervical Spine 2-3 Views  Result Date: 01/27/2020 CLINICAL DATA:  Fall back pain EXAM: CERVICAL SPINE - 2-3 VIEW COMPARISON:  09/07/2011 FINDINGS: Straightening of the cervical spine. Suboptimal visualization of cervicothoracic junction. Vertebral body heights are maintained. Moderate diffuse degenerative changes throughout the cervical spine with disc space narrowing and bulky osteophytes. The dens and lateral masses are within normal limits. IMPRESSION: Suboptimal visualization of cervicothoracic junction. Moderate diffuse degenerative changes. Electronically Signed   By: Donavan Foil M.D.   On: 01/27/2020 19:20   DG Thoracic Spine 2 View  Result Date: 01/27/2020 CLINICAL DATA:  Slipped and fell EXAM: THORACIC SPINE 2 VIEWS COMPARISON:  None. FINDINGS: Thoracic alignment within normal limits. Vertebral body heights are maintained. Minimal degenerative changes IMPRESSION: No acute osseous abnormality. Electronically Signed   By: Donavan Foil M.D.   On: 01/27/2020 19:18   DG Lumbar Spine Complete  Result Date: 01/27/2020 CLINICAL DATA:  Fall EXAM: LUMBAR SPINE - COMPLETE 4+ VIEW COMPARISON:  07/14/2019 FINDINGS: Five non rib-bearing lumbar type vertebra. Stable grade 1 anterolisthesis L3 on L4. Vertebral body heights are maintained. Mild disc space narrowing at L3-L4 and L4-L5. Mild facet degenerative changes of the lower lumbar spine. IMPRESSION: No acute osseous abnormality. Electronically Signed   By: Donavan Foil M.D.   On: 01/27/2020 19:21    Procedures Procedures (including critical care time)  Medications Ordered in UC Medications - No data to display  Initial Impression / Assessment and Plan / UC Course  I have reviewed the triage vital signs and the nursing notes.  Pertinent labs & imaging results that were available during my care of the patient were reviewed by me and considered in my medical decision making (see chart for  details).    Fall resulting in acute bilateral back pain. Imaging of neck, thoracic, and lumbar spine negative. Patient declined Toradol and steroid injection. Treatment per discharge instruction.  Orthopedic evaluation if no improvement of pain.  Final Clinical Impressions(s) / UC Diagnoses   Final diagnoses:  Fall on same level from slipping, initial encounter  Acute bilateral back pain, unspecified back  location   Discharge Instructions   None    ED Prescriptions    Medication Sig Dispense Auth. Provider   cyclobenzaprine (FLEXERIL) 10 MG tablet Take 1 tablet (10 mg total) by mouth 2 (two) times daily as needed for muscle spasms. 30 tablet Scot Jun, FNP   predniSONE (DELTASONE) 20 MG tablet Take 1 tablet (20 mg total) by mouth daily with breakfast. Prednisone 20 mg, in mornings with breakfast as follows: Take 3 pills for 3 days, take 2 pills for 3 days, and take 1 pill for 3 days. 18 tablet Scot Jun, FNP     PDMP not reviewed this encounter.   Scot Jun, FNP 01/30/20 2017    Scot Jun, [place] 01/30/20 2018

## 2020-01-27 NOTE — ED Triage Notes (Signed)
Pt c/o back pain since Sunday when she fell in a grocery store. Says she has been bed ridden for 2 days. Pain cream prn.

## 2020-02-05 DIAGNOSIS — R7303 Prediabetes: Secondary | ICD-10-CM | POA: Insufficient documentation

## 2020-03-23 ENCOUNTER — Emergency Department (INDEPENDENT_AMBULATORY_CARE_PROVIDER_SITE_OTHER)
Admission: EM | Admit: 2020-03-23 | Discharge: 2020-03-23 | Disposition: A | Discharge status: Home / Self Care | Payer: Medicare Other | Source: Home / Self Care | Attending: Family Medicine | Admitting: Family Medicine

## 2020-03-23 ENCOUNTER — Emergency Department (INDEPENDENT_AMBULATORY_CARE_PROVIDER_SITE_OTHER): Payer: Medicare Other

## 2020-03-23 ENCOUNTER — Other Ambulatory Visit: Payer: Self-pay

## 2020-03-23 DIAGNOSIS — M25561 Pain in right knee: Secondary | ICD-10-CM

## 2020-03-23 DIAGNOSIS — M1711 Unilateral primary osteoarthritis, right knee: Secondary | ICD-10-CM

## 2020-03-23 DIAGNOSIS — M545 Low back pain, unspecified: Secondary | ICD-10-CM

## 2020-03-23 DIAGNOSIS — M542 Cervicalgia: Secondary | ICD-10-CM

## 2020-03-23 DIAGNOSIS — W19XXXD Unspecified fall, subsequent encounter: Secondary | ICD-10-CM

## 2020-03-23 MED ORDER — HYDROCODONE-ACETAMINOPHEN 5-325 MG PO TABS: 1.0000 | ORAL_TABLET | Freq: Four times a day (QID) | ORAL | 0 refills | Status: DC | PRN

## 2020-03-23 NOTE — ED Provider Notes (Signed)
[name]    CSN: [id number] Arrival date & time: 03/23/20  1519      History   Chief Complaint No chief complaint on file.   HPI Kelly Rosales is a 70 y.o. female.   HPI  Kelly Rosales is here for follow-up.  She states that she was injured 01/25/2020 when she fell in a business.  She states there was water on the floor.  States she fell directly backward and landed on her back and hit her head. She was seen here on 01/27/2020.  She complained of neck and back pain.  She had cervical spine, thoracic spine, and lumbar spine films performed.  They were negative for acute finding or fracture.  She does have degenerative disc disease. Patient has chronic cervical lumbar degenerative disc disease that is dated almost 20 years ago on medical visits and imaging. Patient states she is having continued left neck pain and headache. She states that she is having continued low back pain daily. She states she has developed right knee pain.  She states the right knee is limiting her ability to walk well, and feels is that that is going to buckle or cause her to fall. Patient was seen here on January 19, then saw her primary care doctor on 02/05/2020 and her bariatric doctor on 02/10/2020.  I reviewed each of those visits that she did not complain of any orthopedic pain or problems, and denied falls.  She has medical documentation of falls in July and then January of this year.  Patient is clear that since her fall in January that her left neck pain is worse, low back pain is worse, and knee pain is new.  Patient states she is unable to take nonsteroidal medications.  She has been taking Tylenol with no improvement.  She took a few of her old hydrocodone and this did help.  She states that the steroids and muscle relaxers prescribed at her last visit helped her very little.   Past Medical History:  Diagnosis Date  . Achilles tendonitis 10/2010  . Ankle deformity 10/2010  .  Chronic abdominal pain   . Chronic back pain   . Chronic leg pain    left  . Chronic neck pain   . Fibromyalgia   . Hypertension   . IBS (irritable bowel syndrome)   . Lumbar radiculopathy 10/2010  . Peripheral neuropathy 10/2010   Secondary to Lumbar Radiculopathy    Patient Active Problem List   Diagnosis Date Noted  . Uterine fibroid 09/18/2014  . HYPERLIPIDEMIA 02/10/2009  . IRRITABLE BOWEL SYNDROME 02/10/2009  . COLONIC POLYPS, HYPERPLASTIC, HX OF 02/10/2009  . ABDOMINAL PAIN, GENERALIZED 12/07/2008    Past Surgical History:  Procedure Laterality Date  . [place] Bunionectomy Left 2003  . Cotton Osteotomy w/ graft Left 04/10/12  . FOOT SURGERY    . Nail Matrixectomy Left 04/10/12  . TAL Lengthening Left 04/10/2012    OB History   No obstetric history on file.      Home Medications    Prior to Admission medications   Medication Sig Start Date End Date Taking? Authorizing Provider  cyclobenzaprine (FLEXERIL) 10 MG tablet Take 1 tablet (10 mg total) by mouth 2 (two) times daily as needed for muscle spasms. 01/27/20   Scot Jun, FNP  HYDROcodone-acetaminophen (NORCO/VICODIN) 5-325 MG tablet Take 1-2 tablets by mouth every 6 (six) hours as needed. 03/23/20   Raylene Everts, MD  methocarbamol (ROBAXIN) 500 MG tablet Take  1 tablet (500 mg total) by mouth 2 (two) times daily. 07/19/19   Noe Gens, PA-C  predniSONE (DELTASONE) 20 MG tablet Take 1 tablet (20 mg total) by mouth daily with breakfast. Prednisone 20 mg, in mornings with breakfast as follows: Take 3 pills for 3 days, take 2 pills for 3 days, and take 1 pill for 3 days. 01/27/20   Scot Jun, FNP    Family History Family History  Problem Relation Age of Onset  . Cancer Mother   . Diabetes Mother   . Heart disease Mother   . Hyperlipidemia Mother   . Hypertension Mother     Social History Social History   Tobacco Use  . Smoking status: Never Smoker  . Smokeless tobacco: Never Used   Vaping Use  . Vaping Use: Never used  Substance Use Topics  . Alcohol use: No  . Drug use: No     Allergies   Demerol [meperidine], Morphine and related, and Penicillins   Review of Systems Review of Systems See HPI  Physical Exam Triage Vital Signs ED Triage Vitals  Enc Vitals Group     BP 03/23/20 1538 (!) 152/88     Pulse Rate 03/23/20 1538 73     Resp 03/23/20 1538 19     Temp 03/23/20 1538 97.8 F (36.6 C)     Temp Source 03/23/20 1538 Oral     SpO2 03/23/20 1538 97 %     Weight 03/23/20 1539 199 lb 8 oz (90.5 kg)     Height 03/23/20 1539 5\' 7"  (1.702 m)     Head Circumference --      Peak Flow --      Pain Score 03/23/20 1536 9     Pain Loc --      Pain Edu? --      Excl. in [place]? --    No data found.  Updated Vital Signs BP (!) 152/88 (BP Location: Left Arm)   Pulse 73   Temp 97.8 F (36.6 C) (Oral)   Resp 19   Ht 5\' 7"  (1.702 m)   Wt 90.5 kg   SpO2 97%   BMI 31.25 kg/m     Physical Exam Constitutional:      General: She is not in acute distress.    Appearance: She is well-developed.     Comments: Mild overweight.  Guarded movements.  HENT:     Head: Normocephalic and atraumatic.     Nose:     Comments: Mask is in place Eyes:     Conjunctiva/sclera: Conjunctivae normal.     Pupils: Pupils are equal, round, and reactive to light.  Neck:     Comments: Tenderness in the left upper body the trapezius and left cervical muscles.  Slow but full range of motion. Cardiovascular:     Rate and Rhythm: Normal rate.  Pulmonary:     Effort: Pulmonary effort is normal. No respiratory distress.  Abdominal:     General: There is no distension.     Palpations: Abdomen is soft.  Musculoskeletal:        General: Normal range of motion.     Cervical back: Normal range of motion. Tenderness present.       Legs:     Comments: Mild tenderness centrally at the L5-S1 junction.  No tenderness over the lumbar muscles.  SI joints and posterior pelvis nontender.   Strength sensation range of motion and reflexes are symmetric in both lower extremities  Skin:    General: Skin is warm and dry.  Neurological:     Mental Status: She is alert.      UC Treatments / Results  Labs (all labs ordered are listed, but only abnormal results are displayed) Labs Reviewed - No data to display  EKG   Radiology DG Knee AP/LAT W/Sunrise Right  Result Date: 03/23/2020 CLINICAL DATA:  Right knee pain after fall several months ago. EXAM: RIGHT KNEE 3 VIEWS COMPARISON:  None. FINDINGS: No fracture or dislocation is noted. Moderate size suprapatellar joint effusion is noted. Moderate narrowing and osteophyte formation is seen involving the medial and lateral joint spaces. IMPRESSION: Moderate degenerative joint disease. Moderate suprapatellar joint effusion. No fracture or dislocation is noted. Electronically Signed   By: Marijo Conception M.D.   On: 03/23/2020 17:24    Procedures Procedures (including critical care time)  Medications Ordered in UC Medications - No data to display  Initial Impression / Assessment and Plan / UC Course  I have reviewed the triage vital signs and the nursing notes.  Pertinent labs & imaging results that were available during my care of the patient were reviewed by me and considered in my medical decision making (see chart for details).      Patient has neck and back pain that per her history is worse after fall.  She does have chronic neck and back conditions.  Often patients with arthritis do poorly after falls.  The knee pain appears to be new, and I have no knowledge of her knee relationship to her fall.  Per her report it is caused by the fall although falling backwards from a standing position is an unusual mechanism of injury for a knee.  In any event she has symptomatic arthritis in that right knee at this time.  Will refer to orthopedic consultation.  I believe she needs physical therapy and additional conservative care for her  injury Final Clinical Impressions(s) / UC Diagnoses   Final diagnoses:  Acute bilateral low back pain without sciatica  Neck pain on left side  Acute pain of right knee  Primary osteoarthritis of right knee  Fall, subsequent encounter     Discharge Instructions     Call [facility] orthopedics in the morning.  They have an urgent care center, or you can see one of their specialists.  Their phone number is [phone] Use heat to painful back and neck muscles Limit your activity when painful Take hydrocodone as needed for severe pain Wear brace when you are walking to keep knee from giving out   ED Prescriptions    Medication Sig Dispense Auth. Provider   HYDROcodone-acetaminophen (NORCO/VICODIN) 5-325 MG tablet Take 1-2 tablets by mouth every 6 (six) hours as needed. 15 tablet Raylene Everts, MD     I have reviewed the PDMP during this encounter.   Raylene Everts, MD 03/23/20 [phone]

## 2020-03-23 NOTE — ED Triage Notes (Signed)
Pt presents with sustained R back and R leg pain since fall earlier in January. Since then pt has also experienced R knee swelling. She also c/o left side head "pain" "throbbing pain like something is going through my head but not headache". Pt has been taking aleeve otc without significant improvement.

## 2020-03-23 NOTE — Discharge Instructions (Signed)
Call [facility] orthopedics in the morning.  They have an urgent care center, or you can see one of their specialists.  Their phone number is [phone] Use heat to painful back and neck muscles Limit your activity when painful Take hydrocodone as needed for severe pain Wear brace when you are walking to keep knee from giving out

## 2020-03-24 ENCOUNTER — Telehealth: Payer: Self-pay | Admitting: Family Medicine

## 2020-03-24 MED ORDER — HYDROCODONE-ACETAMINOPHEN 5-325 MG PO TABS: 1.0000 | ORAL_TABLET | Freq: Four times a day (QID) | ORAL | 0 refills | Status: DC | PRN

## 2020-03-24 NOTE — Telephone Encounter (Signed)
Patient here stating that the pharmacy resent her pain medicine to yesterday does not have it.  She is requesting we send it to another pharmacy. Of note this is the third pharmacy we have sent this prescription to.

## 2020-04-25 ENCOUNTER — Ambulatory Visit: Payer: Medicare Other | Admitting: Podiatry

## 2020-04-25 DIAGNOSIS — M797 Fibromyalgia: Secondary | ICD-10-CM | POA: Insufficient documentation

## 2020-07-01 ENCOUNTER — Encounter: Payer: Self-pay | Admitting: Emergency Medicine

## 2020-07-01 ENCOUNTER — Other Ambulatory Visit: Payer: Self-pay

## 2020-07-01 ENCOUNTER — Emergency Department (INDEPENDENT_AMBULATORY_CARE_PROVIDER_SITE_OTHER): Payer: Medicare Other

## 2020-07-01 ENCOUNTER — Emergency Department (INDEPENDENT_AMBULATORY_CARE_PROVIDER_SITE_OTHER)
Admission: EM | Admit: 2020-07-01 | Discharge: 2020-07-01 | Disposition: A | Discharge status: Home / Self Care | Payer: Self-pay | Source: Home / Self Care

## 2020-07-01 DIAGNOSIS — M79662 Pain in left lower leg: Secondary | ICD-10-CM | POA: Diagnosis not present

## 2020-07-01 DIAGNOSIS — M25562 Pain in left knee: Secondary | ICD-10-CM

## 2020-07-01 NOTE — ED Triage Notes (Signed)
Slipped & fell in January on water in a store  Pain to left lower leg & knee intermittent  Describes as throbbing or numb - it varies  No OTC meds  Has not seen sports medicine or an orthopod yet - plans to  No covid vaccine

## 2020-07-01 NOTE — ED Provider Notes (Signed)
[facility]    CSN: [id number] Arrival date & time: 07/01/20  1430      History   Chief Complaint Chief Complaint  Patient presents with   Leg Pain    Left leg    HPI Kelly Rosales is a 70 y.o. female.   HPI 70 year old female presents with left knee and lower leg pain intermittently for the past 3 months.  Patient was evaluated here on 01/27/2020 and was advised to follow-up with orthopedics which she has not done as of yet.  PMH significant for chronic left leg pain, achilles tendonitis, and fibromyalgia.  Reports that she is stopped taking all of her previously prescribed medications.  Past Medical History:  Diagnosis Date   Achilles tendonitis 10/2010   Ankle deformity 10/2010   Chronic abdominal pain    Chronic back pain    Chronic leg pain    left   Chronic neck pain    Fibromyalgia    Hypertension    IBS (irritable bowel syndrome)    Lumbar radiculopathy 10/2010   Peripheral neuropathy 10/2010   Secondary to Lumbar Radiculopathy    Patient Active Problem List   Diagnosis Date Noted   Fibromyalgia 04/25/2020   Prediabetes 02/05/2020   Type 2 diabetes mellitus without complication, without long-term current use of insulin ([place]) 07/08/2018   Benign essential hypertension 01/17/2018   Class 1 obesity due to excess calories with serious comorbidity and body mass index (BMI) of 33.0 to 33.9 in adult 01/17/2018   Uterine fibroid 09/18/2014   HYPERLIPIDEMIA 02/10/2009   IRRITABLE BOWEL SYNDROME 02/10/2009   COLONIC POLYPS, HYPERPLASTIC, HX OF 02/10/2009   ABDOMINAL PAIN, GENERALIZED 12/07/2008    Past Surgical History:  Procedure Laterality Date   [name] Bunionectomy Left 2003   Cotton Osteotomy w/ graft Left 04/10/12   FOOT SURGERY     Nail Matrixectomy Left 04/10/12   TAL Lengthening Left 04/10/2012    OB History   No obstetric history on file.      Home Medications    Prior to Admission medications   Medication Sig Start Date End  Date Taking? Authorizing Provider  clonazePAM (KLONOPIN) 0.5 MG tablet Take 0.5 mg by mouth daily as needed. Patient not taking: Reported on 07/01/2020 04/22/20   [provider]  Coenzyme Q10 200 MG capsule Take 1 tablet by mouth daily. Patient not taking: Reported on 07/01/2020 03/15/10   [provider]  cyclobenzaprine (FLEXERIL) 10 MG tablet Take 1 tablet (10 mg total) by mouth 2 (two) times daily as needed for muscle spasms. Patient not taking: Reported on 07/01/2020 01/27/20   Scot Jun, FNP  esomeprazole (NEXIUM) 40 MG capsule Take by mouth. Patient not taking: Reported on 07/01/2020 03/15/10   [provider]  Flaxseed, Linseed, (FLAX SEED OIL) 1000 MG CAPS Take by mouth. Patient not taking: Reported on 07/01/2020 03/15/10   [provider]  FLUoxetine (PROZAC) 20 MG capsule Take 20 mg by mouth daily. 04/22/20   [provider]  gabapentin (NEURONTIN) 100 MG capsule Take 100 mg by mouth 3 (three) times daily. Patient not taking: Reported on 07/01/2020 04/22/20   [provider]  HYDROcodone-acetaminophen (NORCO/VICODIN) 5-325 MG tablet Take 1-2 tablets by mouth every 6 (six) hours as needed. Patient not taking: Reported on 07/01/2020 03/24/20   Raylene Everts, MD  lisinopril-hydrochlorothiazide (ZESTORETIC) 20-25 MG tablet Take 1 tablet by mouth daily. Patient not taking: Reported on 07/01/2020 07/10/18   [provider]  loratadine (  CLARITIN) 10 MG tablet Take 1 tablet by mouth daily. Patient not taking: Reported on 07/01/2020 04/05/10   [provider]  methocarbamol (ROBAXIN) 500 MG tablet Take 1 tablet (500 mg total) by mouth 2 (two) times daily. Patient not taking: Reported on 07/01/2020 07/19/19   Noe Gens, PA-C  Multiple Vitamin (MULTI-VITAMIN) tablet Take 1 tablet by mouth daily. Patient not taking: Reported on 07/01/2020 03/15/10   [provider]  naproxen sodium (ALEVE) 220 MG tablet Take by  mouth. Patient not taking: Reported on 07/01/2020 03/15/10   [provider]  Omega-3 Fatty Acids ([employer] FISH OIL) 1200 MG CAPS Take by mouth. 03/15/10   [provider]  Omega-3 Fatty Acids (KP FISH OIL) 1200 MG CAPS Take 1 tablet by mouth daily. Patient not taking: Reported on 07/01/2020 03/15/10   [provider]  predniSONE (DELTASONE) 20 MG tablet Take 1 tablet (20 mg total) by mouth daily with breakfast. Prednisone 20 mg, in mornings with breakfast as follows: Take 3 pills for 3 days, take 2 pills for 3 days, and take 1 pill for 3 days. Patient not taking: Reported on 07/01/2020 01/27/20   Scot Jun, FNP  rosuvastatin (CRESTOR) 5 MG tablet Take 1 tablet by mouth daily. Patient not taking: Reported on 07/01/2020 03/15/10   [provider]  sertraline (ZOLOFT) 100 MG tablet Take 1 tablet by mouth daily. Patient not taking: Reported on 07/01/2020 03/15/10   [provider]    Family History Family History  Problem Relation Age of Onset   Cancer Mother    Diabetes Mother    Heart disease Mother    Hyperlipidemia Mother    Hypertension Mother     Social History Social History   Tobacco Use   Smoking status: Never   Smokeless tobacco: Never  Vaping Use   Vaping Use: Never used  Substance Use Topics   Alcohol use: No   Drug use: No     Allergies   Demerol [meperidine], Morphine and related, and Penicillins   Review of Systems Review of Systems  Constitutional: Negative.   HENT: Negative.    Respiratory: Negative.    Cardiovascular: Negative.   Gastrointestinal: Negative.   Musculoskeletal:        ,Left knee pain and left lower leg pain for 3 months  Skin: Negative.   Neurological: Negative.     Physical Exam Triage Vital Signs ED Triage Vitals  Enc Vitals Group     BP 07/01/20 1445 (!) 149/86     Pulse Rate 07/01/20 1445 72     Resp 07/01/20 1445 16     Temp 07/01/20 1445 99.4 F (37.4 C)     Temp Source 07/01/20 1445  Oral     SpO2 07/01/20 1445 96 %     Weight --      Height --      Head Circumference --      Peak Flow --      Pain Score 07/01/20 1448 4     Pain Loc --      Pain Edu? --      Excl. in [place]? --    No data found.  Updated Vital Signs BP (!) 149/86 (BP Location: Right Arm)   Pulse 72   Temp 99.4 F (37.4 C) (Oral)   Resp 16   SpO2 96%     Physical Exam Vitals and nursing note reviewed.  Constitutional:      General: She is not  in acute distress.    Appearance: Normal appearance. She is obese. She is not ill-appearing.  HENT:     Head: Normocephalic and atraumatic.     Mouth/Throat:     Mouth: Mucous membranes are moist.     Pharynx: Oropharynx is clear.  Eyes:     Extraocular Movements: Extraocular movements intact.     Conjunctiva/sclera: Conjunctivae normal.     Pupils: Pupils are equal, round, and reactive to light.  Cardiovascular:     Rate and Rhythm: Normal rate and regular rhythm.     Pulses: Normal pulses.     Heart sounds: Normal heart sounds.  Pulmonary:     Effort: Pulmonary effort is normal. No respiratory distress.     Breath sounds: Normal breath sounds. No wheezing, rhonchi or rales.  Musculoskeletal:     Cervical back: Normal range of motion and neck supple.     Comments: Left knee: TTP over medial inferior border of patella, mild soft tissue swelling noted  Skin:    General: Skin is warm and dry.  Neurological:     General: No focal deficit present.     Mental Status: She is alert and oriented to person, place, and time.  Psychiatric:        Mood and Affect: Mood normal.        Behavior: Behavior normal.     UC Treatments / Results  Labs (all labs ordered are listed, but only abnormal results are displayed) Labs Reviewed - No data to display  EKG   Radiology DG Tibia/Fibula Left  Result Date: 07/01/2020 CLINICAL DATA:  Status post fall. EXAM: LEFT TIBIA AND FIBULA - 2 VIEW COMPARISON:  None. FINDINGS: There is no evidence of fracture  or other focal bone lesions. Degenerative changes are seen involving the visualized portion of the left knee. Soft tissues are unremarkable. IMPRESSION: No acute osseous abnormality. Electronically Signed   By: Virgina Norfolk M.D.   On: 07/01/2020 16:04   DG Knee Complete 4 Views Left  Result Date: 07/01/2020 CLINICAL DATA:  Status post trauma with left knee pain. EXAM: LEFT KNEE - COMPLETE 4+ VIEW COMPARISON:  MR knee, dated April 21, 2013 and left knee plain films, dated September 18, 2004. FINDINGS: No evidence of acute fracture or dislocation. Mild to moderate severity medial and lateral tibiofemoral compartment space narrowing is seen. Marginal osteophytes are noted along the distal left femur and proximal left tibia. A moderate sized joint effusion is noted. IMPRESSION: 1. Degenerative changes without evidence of acute osseous abnormality. 2. Moderate sized joint effusion. Electronically Signed   By: Virgina Norfolk M.D.   On: 07/01/2020 16:03    Procedures Procedures (including critical care time)  Medications Ordered in UC Medications - No data to display  Initial Impression / Assessment and Plan / UC Course  I have reviewed the triage vital signs and the nursing notes.  Pertinent labs & imaging results that were available during my care of the patient were reviewed by me and considered in my medical decision making (see chart for details).    MDM: 1.  Left knee pain, 2.  Left lower leg pain.  Patient discharged home, hemodynamically stable. Final Clinical Impressions(s) / UC Diagnoses   Final diagnoses:  Acute pain of left knee  Pain in left lower leg     Discharge Instructions      Advised/instructed patient may take previously prescribed naproxen daily, as needed for left knee pain and left lower leg pain.  Encouraged patient to follow-up with orthopedics if symptoms worsen and or unresolved.     ED Prescriptions   None    PDMP not reviewed this encounter.    Eliezer Lofts, [place] 07/01/20 1623

## 2020-07-01 NOTE — Discharge Instructions (Addendum)
Advised/instructed patient may take previously prescribed naproxen daily, as needed for left knee pain and left lower leg pain.  Encouraged patient to follow-up with orthopedics if symptoms worsen and or unresolved.

## 2020-07-05 ENCOUNTER — Ambulatory Visit (INDEPENDENT_AMBULATORY_CARE_PROVIDER_SITE_OTHER): Payer: Self-pay | Admitting: Sports Medicine

## 2020-07-05 ENCOUNTER — Other Ambulatory Visit: Payer: Self-pay

## 2020-07-05 DIAGNOSIS — R200 Anesthesia of skin: Secondary | ICD-10-CM | POA: Insufficient documentation

## 2020-07-05 DIAGNOSIS — R208 Other disturbances of skin sensation: Secondary | ICD-10-CM

## 2020-07-05 MED ORDER — NAPROXEN 500 MG PO TABS: 500.0000 mg | ORAL_TABLET | Freq: Two times a day (BID) | ORAL | 3 refills | Status: DC

## 2020-07-05 NOTE — Addendum Note (Signed)
Addended by: Dema Severin on: 07/05/2020 11:57 AM   Modules accepted: Orders

## 2020-07-05 NOTE — Progress Notes (Signed)
    Procedures performed today:    None.  Independent interpretation of notes and tests performed by another provider:   She had a lumbar spine MRI in 2014, personally reviewed that showed multilevel DDD but no left-sided L5 or S1 compression.  Granted this MRI is 70 years old.  Brief History, Exam, Impression, and Recommendations:    Pain, numbness, tingling of left foot Shaeleigh is a pleasant 70 year old female, she is going through some difficult life situations right now. She has been having pain, numbness and tingling in her left foot going to the last 2 toes. She also endorses pain radiating up her lower leg, she does have some back pain. On exam she does have some tarsometatarsal bossing, obvious osteoarthritis but there are no discrete areas of tenderness to palpation in her foot. I think symptoms are likely radicular, we will add naproxen 500 twice daily, lumbar spine rehab exercises, lumbar spine x-rays, foot x-rays, return to see me in 4 weeks, we will consider gabapentin if not better as she is having significant nighttime symptoms.    ___________________________________________ Gwen Her. Dianah Field, M.D., ABFM., CAQSM. Primary Care and [place] Instructor of [employer] [facility] [employer] [facility]

## 2020-07-05 NOTE — Assessment & Plan Note (Signed)
Lincoln is a pleasant 70 year old female, she is going through some difficult life situations right now. She has been having pain, numbness and tingling in her left foot going to the last 2 toes. She also endorses pain radiating up her lower leg, she does have some back pain. On exam she does have some tarsometatarsal bossing, obvious osteoarthritis but there are no discrete areas of tenderness to palpation in her foot. I think symptoms are likely radicular, we will add naproxen 500 twice daily, lumbar spine rehab exercises, lumbar spine x-rays, foot x-rays, return to see me in 4 weeks, we will consider gabapentin if not better as she is having significant nighttime symptoms.

## 2020-07-22 ENCOUNTER — Encounter: Payer: Self-pay | Admitting: Podiatry

## 2020-07-22 ENCOUNTER — Ambulatory Visit (INDEPENDENT_AMBULATORY_CARE_PROVIDER_SITE_OTHER): Payer: Medicare Other

## 2020-07-22 ENCOUNTER — Ambulatory Visit (INDEPENDENT_AMBULATORY_CARE_PROVIDER_SITE_OTHER): Payer: Medicare Other | Admitting: Podiatry

## 2020-07-22 ENCOUNTER — Other Ambulatory Visit: Payer: Self-pay

## 2020-07-22 DIAGNOSIS — M722 Plantar fascial fibromatosis: Secondary | ICD-10-CM | POA: Diagnosis not present

## 2020-07-22 DIAGNOSIS — M778 Other enthesopathies, not elsewhere classified: Secondary | ICD-10-CM

## 2020-07-22 DIAGNOSIS — R200 Anesthesia of skin: Secondary | ICD-10-CM

## 2020-07-22 DIAGNOSIS — W19XXXA Unspecified fall, initial encounter: Secondary | ICD-10-CM

## 2020-07-22 DIAGNOSIS — M79671 Pain in right foot: Secondary | ICD-10-CM

## 2020-07-22 DIAGNOSIS — M79673 Pain in unspecified foot: Secondary | ICD-10-CM

## 2020-07-22 DIAGNOSIS — M79672 Pain in left foot: Secondary | ICD-10-CM | POA: Diagnosis not present

## 2020-07-22 DIAGNOSIS — R208 Other disturbances of skin sensation: Secondary | ICD-10-CM | POA: Diagnosis not present

## 2020-07-22 DIAGNOSIS — M549 Dorsalgia, unspecified: Secondary | ICD-10-CM | POA: Diagnosis not present

## 2020-07-22 MED ORDER — MELOXICAM 7.5 MG PO TABS: 7.5000 mg | ORAL_TABLET | Freq: Every day | ORAL | 0 refills | Status: DC

## 2020-07-22 NOTE — Patient Instructions (Addendum)
If was nice to meet you today. If you have any questions or any further concerns, please feel fee to give me a call. You can call our office at [phone] or please feel fee to send me a message through MyChart.   ----  For instructions on how to put on your Plantar Fascial Brace, please visit [email or web address]  ---     Plantar Fasciitis (Heel Spur Syndrome) with Rehab The plantar fascia is a fibrous, ligament-like, soft-tissue structure that spans the bottom of the foot. Plantar fasciitis is a condition that causes pain in the foot due to inflammation of the tissue. SYMPTOMS  Pain and tenderness on the underneath side of the foot. Pain that worsens with standing or walking. CAUSES  Plantar fasciitis is caused by irritation and injury to the plantar fascia on the underneath side of the foot. Common mechanisms of injury include: Direct trauma to bottom of the foot. Damage to a small nerve that runs under the foot where the main fascia attaches to the heel bone. Stress placed on the plantar fascia due to bone spurs. RISK INCREASES WITH:  Activities that place stress on the plantar fascia (running, jumping, pivoting, or cutting). Poor strength and flexibility. Improperly fitted shoes. Tight calf muscles. Flat feet. Failure to warm-up properly before activity. Obesity. PREVENTION Warm up and stretch properly before activity. Allow for adequate recovery between workouts. Maintain physical fitness: Strength, flexibility, and endurance. Cardiovascular fitness. Maintain a health body weight. Avoid stress on the plantar fascia. Wear properly fitted shoes, including arch supports for individuals who have flat feet.  PROGNOSIS  If treated properly, then the symptoms of plantar fasciitis usually resolve without surgery. However, occasionally surgery is necessary.  RELATED COMPLICATIONS  Recurrent symptoms that may result in a chronic condition. Problems of the lower back  that are caused by compensating for the injury, such as limping. Pain or weakness of the foot during push-off following surgery. Chronic inflammation, scarring, and partial or complete fascia tear, occurring more often from repeated injections.  TREATMENT  Treatment initially involves the use of ice and medication to help reduce pain and inflammation. The use of strengthening and stretching exercises may help reduce pain with activity, especially stretches of the Achilles tendon. These exercises may be performed at home or with a therapist. Your caregiver may recommend that you use heel cups of arch supports to help reduce stress on the plantar fascia. Occasionally, corticosteroid injections are given to reduce inflammation. If symptoms persist for greater than 6 months despite non-surgical (conservative), then surgery may be recommended.   MEDICATION  If pain medication is necessary, then nonsteroidal anti-inflammatory medications, such as aspirin and ibuprofen, or other minor pain relievers, such as acetaminophen, are often recommended. Do not take pain medication within 7 days before surgery. Prescription pain relievers may be given if deemed necessary by your caregiver. Use only as directed and only as much as you need. Corticosteroid injections may be given by your caregiver. These injections should be reserved for the most serious cases, because they may only be given a certain number of times.  HEAT AND COLD Cold treatment (icing) relieves pain and reduces inflammation. Cold treatment should be applied for 10 to 15 minutes every 2 to 3 hours for inflammation and pain and immediately after any activity that aggravates your symptoms. Use ice packs or massage the area with a piece of ice (ice massage). Heat treatment may be used prior to performing the stretching and strengthening activities prescribed by your caregiver,   physical therapist, or athletic trainer. Use a heat pack or soak the injury  in warm water.  SEEK IMMEDIATE MEDICAL CARE IF: Treatment seems to offer no benefit, or the condition worsens. Any medications produce adverse side effects.  EXERCISES- RANGE OF MOTION (ROM) AND STRETCHING EXERCISES - Plantar Fasciitis (Heel Spur Syndrome) These exercises may help you when beginning to rehabilitate your injury. Your symptoms may resolve with or without further involvement from your physician, physical therapist or athletic trainer. While completing these exercises, remember:  Restoring tissue flexibility helps normal motion to return to the joints. This allows healthier, less painful movement and activity. An effective stretch should be held for at least 30 seconds. A stretch should never be painful. You should only feel a gentle lengthening or release in the stretched tissue.  RANGE OF MOTION - Toe Extension, Flexion Sit with your right / left leg crossed over your opposite knee. Grasp your toes and gently pull them back toward the top of your foot. You should feel a stretch on the bottom of your toes and/or foot. Hold this stretch for 10 seconds. Now, gently pull your toes toward the bottom of your foot. You should feel a stretch on the top of your toes and or foot. Hold this stretch for 10 seconds. Repeat  times. Complete this stretch 3 times per day.   RANGE OF MOTION - Ankle Dorsiflexion, Active Assisted Remove shoes and sit on a chair that is preferably not on a carpeted surface. Place right / left foot under knee. Extend your opposite leg for support. Keeping your heel down, slide your right / left foot back toward the chair until you feel a stretch at your ankle or calf. If you do not feel a stretch, slide your bottom forward to the edge of the chair, while still keeping your heel down. Hold this stretch for 10 seconds. Repeat 3 times. Complete this stretch 2 times per day.   STRETCH  Gastroc, Standing Place hands on wall. Extend right / left leg, keeping the  front knee somewhat bent. Slightly point your toes inward on your back foot. Keeping your right / left heel on the floor and your knee straight, shift your weight toward the wall, not allowing your back to arch. You should feel a gentle stretch in the right / left calf. Hold this position for 10 seconds. Repeat 3 times. Complete this stretch 2 times per day.  STRETCH  Soleus, Standing Place hands on wall. Extend right / left leg, keeping the other knee somewhat bent. Slightly point your toes inward on your back foot. Keep your right / left heel on the floor, bend your back knee, and slightly shift your weight over the back leg so that you feel a gentle stretch deep in your back calf. Hold this position for 10 seconds. Repeat 3 times. Complete this stretch 2 times per day.  STRETCH  Gastrocsoleus, Standing  Note: This exercise can place a lot of stress on your foot and ankle. Please complete this exercise only if specifically instructed by your caregiver.  Place the ball of your right / left foot on a step, keeping your other foot firmly on the same step. Hold on to the wall or a rail for balance. Slowly lift your other foot, allowing your body weight to press your heel down over the edge of the step. You should feel a stretch in your right / left calf. Hold this position for 10 seconds. Repeat this exercise with a   left knee. Repeat 3 times. Complete this stretch 2 times per day.   STRENGTHENING EXERCISES - Plantar Fasciitis (Heel Spur Syndrome)  These exercises may help you when beginning to rehabilitate your injury. They may resolve your symptoms with or without further involvement from your physician, physical therapist or athletic trainer. While completing these exercises, remember:  Muscles can gain both the endurance and the strength needed for everyday activities through controlled exercises. Complete these exercises as instructed by your physician, physical  therapist or athletic trainer. Progress the resistance and repetitions only as guided.  STRENGTH - Towel Curls Sit in a chair positioned on a non-carpeted surface. Place your foot on a towel, keeping your heel on the floor. Pull the towel toward your heel by only curling your toes. Keep your heel on the floor. Repeat 3 times. Complete this exercise 2 times per day.  STRENGTH - Ankle Inversion Secure one end of a rubber exercise band/tubing to a fixed object (table, pole). Loop the other end around your foot just before your toes. Place your fists between your knees. This will focus your strengthening at your ankle. Slowly, pull your big toe up and in, making sure the band/tubing is positioned to resist the entire motion. Hold this position for 10 seconds. Have your muscles resist the band/tubing as it slowly pulls your foot back to the starting position. Repeat 3 times. Complete this exercises 2 times per day.  Document Released: 12/25/2004 Document Revised: 03/19/2011 Document Reviewed: 04/08/2008 [facility] Patient Information 2014 [place], [place].  Meloxicam tablets What is this medication? MELOXICAM (mel OX i cam) is a non-steroidal anti-inflammatory drug, also knownas an NSAID. It is used to treat pain, inflammation, and swelling. This medicine may be used for other purposes; ask your health care provider orpharmacist if you have questions. COMMON BRAND NAME(S): Mobic What should I tell my care team before I take this medication? They need to know if you have any of these conditions: asthma (lung or breathing disease) bleeding disorder coronary artery bypass graft (CABG) within the past 2 weeks dehydration heart attack heart disease heart failure high blood pressure if you often drink alcohol kidney disease liver disease smoke tobacco cigarettes stomach bleeding stomach ulcers, other stomach or intestine problems take medicines that treat or prevent blood clots taking other  steroids like dexamethasone or prednisone an unusual or allergic reaction to meloxicam, other medicines, foods, dyes, or preservatives pregnant or trying to get pregnant breast-feeding How should I use this medication? Take this medicine by mouth. Take it as directed on the prescription label at the same time every day. You can take it with or without food. If it upsets your stomach, take it with food. Do not use it more often than directed. There may be unused or extra doses in the bottle after you finish your treatment.Talk to your health care provider if you have questions about your dose. A special MedGuide will be given to you by the pharmacist with eachprescription and refill. Be sure to read this information carefully each time. Talk to your health care provider about the use of this medicine in children.Special care may be needed. Patients over 26 years of age may have a stronger reaction and need a smallerdose. Overdosage: If you think you have taken too much of this medicine contact apoison control center or emergency room at once. NOTE: This medicine is only for you. Do not share this medicine with others. What if I miss a dose? If you miss a dose,  take it as soon as you can. If it is almost time for yournext dose, take only that dose. Do not take double or extra doses. What may interact with this medication? Do not take this medicine with any of the following medications: cidofovir ketorolac This medicine may also interact with the following medications: aspirin and aspirin-like medicines certain medicines for blood pressure, heart disease, irregular heart beat certain medicines for depression, anxiety, or psychotic disturbances certain medicines that treat or prevent blood clots like warfarin, enoxaparin, dalteparin, apixaban, dabigatran, rivaroxaban cyclosporine diuretics fluconazole lithium methotrexate other NSAIDs, medicines for pain and inflammation, like ibuprofen and  naproxen pemetrexed This list may not describe all possible interactions. Give your health care provider a list of all the medicines, herbs, non-prescription drugs, or dietary supplements you use. Also tell them if you smoke, drink alcohol, or use illegaldrugs. Some items may interact with your medicine. What should I watch for while using this medication? Visit your health care provider for regular checks on your progress. Tell your health care provider if your symptoms do not start to get better or if they getworse. Do not take other medicines that contain aspirin, ibuprofen, or naproxen with this medicine. Side effects such as stomach upset, nausea, or ulcers may be more likely to occur. Many non-prescription medicines contain aspirin,ibuprofen, or naproxen. Always read labels carefully. This medicine can cause serious ulcers and bleeding in the stomach. It can happen with no warning. Smoking, drinking alcohol, older age, and poor health can also increase risks. Call your health care provider right away if you havestomach pain or blood in your vomit or stool. This medicine does not prevent a heart attack or stroke. This medicine may increase the chance of a heart attack or stroke. The chance may increase the longer you use this medicine or if you have heart disease. If you take aspirin to prevent a heart attack or stroke, talk to your health care provider aboutusing this medicine. Alcohol may interfere with the effect of this medicine. Avoid alcoholic drinks. This medicine may cause serious skin reactions. They can happen weeks to months after starting the medicine. Contact your health care provider right away if you notice fevers or flu-like symptoms with a rash. The rash may be red or purple and then turn into blisters or peeling of the skin. Or, you might notice a red rash with swelling of the face, lips or lymph nodes in your neck or underyour arms. Talk to your health care provider if you are  pregnant before taking this medicine. Taking this medicine between weeks 20 and 30 of pregnancy may harm your unborn baby. Your health care provider will monitor you closely if youneed to take it. After 30 weeks of pregnancy, do not take this medicine. You may get drowsy or dizzy. Do not drive, use machinery, or do anything that needs mental alertness until you know how this medicine affects you. Do not stand up or sit up quickly, especially if you are an older patient. Thisreduces the risk of dizzy or fainting spells. Be careful brushing or flossing your teeth or using a toothpick because you may get an infection or bleed more easily. If you have any dental work done, Primary school teacher you are receiving this medicine. This medicine may make it more difficult to get pregnant. Talk to your healthcare provider if you are concerned about your fertility. What side effects may I notice from receiving this medication? Side effects that you should report to your doctor  or health care professionalas soon as possible: allergic reactions (skin rash, itching or hives; swelling of the face, lips, or tongue) bleeding (bloody or black, tarry stools; red or dark brown urine; spitting up blood or brown material that looks like coffee grounds; red spots on the skin; unusual bruising or bleeding from the eyes, gums, or nose) blood clot (chest pain; shortness of breath; pain, swelling, or warmth in the leg) general ill feeling or flu-like symptoms high potassium levels (chest pain; fast, irregular heartbeat; muscle weakness) kidney injury (trouble passing urine or change in the amount of urine) light-colored stool liver injury (dark yellow or brown urine; general ill feeling or flu-like symptoms; loss of appetite, right upper belly pain; unusually weak or tired, yellowing of the eyes or skin) low red blood cell counts (trouble breathing; feeling faint; lightheaded, falls; unusually weak or tired) rash, fever, and swollen  lymph nodes redness, blistering, peeling, or loosening of the skin, including inside the mouth stroke (changes in vision; confusion; trouble speaking or understanding; severe headaches; sudden numbness or weakness of the face, arm or leg; trouble walking; dizziness; loss of balance or coordination) Side effects that usually do not require medical attention (report to yourdoctor or health care professional if they continue or are bothersome): constipation diarrhea dizziness gas headache nausea, vomiting This list may not describe all possible side effects. Call your doctor for medical advice about side effects. You may report side effects to FDA at1-800-FDA-1088. Where should I keep my medication? Keep out of the reach of children and pets. Store at room temperature between 20 and 25 degrees C (68 and 77 degrees F).Protect from moisture. Keep the container tightly closed. Get rid of any unused medicine after the expiration date. To get rid of medicines that are no longer needed or have expired: Take the medicine to a medicine take-back program. Check with your pharmacy or law enforcement to find a location. If you cannot return the medicine, check the label or package insert to see if the medicine should be thrown out in the garbage or flushed down the toilet. If you are not sure, ask your health care provider. If it is safe to put it in the trash, empty the medicine out of the container. Mix the medicine with cat litter, dirt, coffee grounds, or other unwanted substance. Seal the mixture in a bag or container. Put it in the trash. NOTE: This sheet is a summary. It may not cover all possible information. If you have questions about this medicine, talk to your doctor, pharmacist, orhealth care provider.  2022 Elsevier/Gold Standard (2019-12-07 15:14:36)

## 2020-07-27 NOTE — Progress Notes (Signed)
Subjective:   Patient ID: Kelly Rosales, female   DOB: 70 y.o.   MRN: [id number]   HPI 70 year old female presents the office for concerns of bilateral foot pain.  She states the pain is not every day but she does get pain to the arch area near the ball of the foot.  This started after she slipped and fell on water on January 27, 2020.  She initially was seen in the emergency department.  She wrapped her foot.  She states that she did not have any pain prior to the injury.  She does get occasional swelling.  No numbness or tingling.  Previous history of bunion surgery   Review of Systems  All other systems reviewed and are negative.  Past Medical History:  Diagnosis Date   Achilles tendonitis 10/2010   Ankle deformity 10/2010   Chronic abdominal pain    Chronic back pain    Chronic leg pain    left   Chronic neck pain    Fibromyalgia    Hypertension    IBS (irritable bowel syndrome)    Lumbar radiculopathy 10/2010   Peripheral neuropathy 10/2010   Secondary to Lumbar Radiculopathy    Past Surgical History:  Procedure Laterality Date   [name] Bunionectomy Left 2003   Cotton Osteotomy w/ graft Left 04/10/12   FOOT SURGERY     Nail Matrixectomy Left 04/10/12   TAL Lengthening Left 04/10/2012     Current Outpatient Medications:    meloxicam (MOBIC) 7.5 MG tablet, Take 1 tablet (7.5 mg total) by mouth daily., Disp: 30 tablet, Rfl: 0  Allergies  Allergen Reactions   Demerol [Meperidine] Nausea And Vomiting   Morphine And Related Nausea And Vomiting    Can take hydrocodone   Penicillins Rash          Objective:  Physical Exam  General: AAO x3, NAD  Dermatological: Skin is warm, dry and supple bilateral. There are no open sores, no preulcerative lesions, no rash or signs of infection present.  Vascular: Dorsalis Pedis artery and Posterior Tibial artery pedal pulses are 2/4 bilateral with immedate capillary fill time. There is no pain with calf compression,  swelling, warmth, erythema.   Neruologic: Grossly intact via light touch bilateral.  Negative Tinel sign.  Musculoskeletal: No area of pinpoint tenderness identified today.  There is discomfort on the medial band plantar fascia the arch of the foot.  Mild discomfort into the insertion into the calcaneus from the plantar fascial.  Equinus is present.  No pain with Achilles tendon.  No sign of edema, erythema.  Muscular strength 5/5 in all groups tested bilateral.  Gait: Unassisted, Nonantalgic.       Assessment:    Plantar fasciitis     Plan:  -Treatment options discussed including all alternatives, risks, and complications -Etiology of symptoms were discussed -X-rays obtained and independent reviewed.  No evidence of acute fracture.  Awaiting radiology report. -Pain is intermittent and mostly localized to the arch.  Think this is likely due to her plantar fasciitis.  Discussed stretching, icing daily.  Short course of meloxicam as needed.  Discussion modifications and arch supports.  Physical therapy if needed. -Plantar fascial brace was dispensed x2  As an aside she did talk a lot about how she recently just lost her daughter and she is having some legal issues with the family.  She states been a lot of stress for this we discussed talking to her primary care physician regards to medications that can  be helpful but she does not want to take any medications to help with the stress.  Trula Slade DPM

## 2020-08-02 ENCOUNTER — Ambulatory Visit: Payer: Medicare Other | Admitting: Sports Medicine

## 2020-08-10 ENCOUNTER — Ambulatory Visit (INDEPENDENT_AMBULATORY_CARE_PROVIDER_SITE_OTHER): Payer: Self-pay | Admitting: Sports Medicine

## 2020-08-10 ENCOUNTER — Other Ambulatory Visit: Payer: Self-pay

## 2020-08-10 DIAGNOSIS — R200 Anesthesia of skin: Secondary | ICD-10-CM

## 2020-08-10 DIAGNOSIS — R208 Other disturbances of skin sensation: Secondary | ICD-10-CM

## 2020-08-10 NOTE — Progress Notes (Signed)
    Procedures performed today:    None.  Independent interpretation of notes and tests performed by another provider:   None.  Brief History, Exam, Impression, and Recommendations:    Pain, numbness, tingling of left foot Marlesa returns, she is a 70 year old female, she has bilateral lumbar radicular symptoms with numbness and tingling down the back of the legs to the feet.  I have suggested NSAIDs at the prior visit, as well as home physical therapy. Lumbar spine x-rays did show multilevel DDD, we had suggested gabapentin if she was not better followed by MRI and potentially epidurals. In addition she was having some pain on the left foot, dorsal midfoot with tarsometatarsal bossing consistent with osteoarthritis. She got a second opinion from podiatry, continues to have pain in spite of bracing, I had suggested gabapentin as above, custom orthotics, followed by an ultrasound-guided intertarsal joint injection. She tells me she would like to get a third opinion from orthopedic surgery before considering any of the above, I think this is entirely appropriate, she can return to see me on an as-needed basis if she desires.    ___________________________________________ Gwen Her. Dianah Field, M.D., ABFM., CAQSM. Primary Care and [place] In[employer] of [place] [employer] [facility] [employer]

## 2020-08-10 NOTE — Assessment & Plan Note (Addendum)
Kelly Rosales returns, she is a 70 year old female, she has bilateral lumbar radicular symptoms with numbness and tingling down the back of the legs to the feet.  I have suggested NSAIDs at the prior visit, as well as home physical therapy. Lumbar spine x-rays did show multilevel DDD, we had suggested gabapentin if she was not better followed by MRI and potentially epidurals. In addition she was having some pain on the left foot, dorsal midfoot with tarsometatarsal bossing consistent with osteoarthritis. She got a second opinion from podiatry, continues to have pain in spite of bracing, I had suggested gabapentin as above, custom orthotics, followed by an ultrasound-guided intertarsal joint injection. She tells me she would like to get a third opinion from orthopedic surgery before considering any of the above, I think this is entirely appropriate, she can return to see me on an as-needed basis if she desires.

## 2020-09-23 ENCOUNTER — Ambulatory Visit (INDEPENDENT_AMBULATORY_CARE_PROVIDER_SITE_OTHER): Payer: Medicare Other | Admitting: Podiatry

## 2020-09-23 ENCOUNTER — Encounter: Payer: Self-pay | Admitting: Podiatry

## 2020-09-23 ENCOUNTER — Other Ambulatory Visit: Payer: Self-pay

## 2020-09-23 DIAGNOSIS — M79675 Pain in left toe(s): Secondary | ICD-10-CM | POA: Diagnosis not present

## 2020-09-23 DIAGNOSIS — B351 Tinea unguium: Secondary | ICD-10-CM | POA: Diagnosis not present

## 2020-09-23 DIAGNOSIS — M7752 Other enthesopathy of left foot: Secondary | ICD-10-CM

## 2020-09-23 DIAGNOSIS — M79674 Pain in right toe(s): Secondary | ICD-10-CM | POA: Diagnosis not present

## 2020-09-23 DIAGNOSIS — M722 Plantar fascial fibromatosis: Secondary | ICD-10-CM

## 2020-09-25 NOTE — Progress Notes (Addendum)
Subjective: 70 year old female presents the office today for concerns of thick, elongated toenails that she cannot trim her self.  She states that she also gets intermittent pain to her feet.  She also gets some fluid in her ankles when the lateral aspect along the sinus tarsi.  She has not had any recent injury or trauma.  She states that her husband has a brace that she wears at home which appears to be a leg brace which does help.  My last saw her she was having more [place] fasciitis symptoms we did plantar fascial braces which have been somewhat helpful.  She states that she likes to walk and be active.  She does get some numbness and tingling to her feet.  She has a documented history of peripheral neuropathy secondary to lumbar radiculopathy.  Objective: AAO x3, NAD DP/PT pulses palpable bilaterally, CRT less than 3 seconds Nails are hypertrophic, dystrophic, brittle, discolored, elongated 10. No surrounding redness or drainage. Tenderness nails 1-5 bilaterally. No open lesions or pre-ulcerative lesions are identified today. Minimal edema present sinus tarsi area bilaterally without any erythema or warmth.  Not able to elicit any area of pinpoint tenderness.  Ankle, subtalar joint range of motion intact but any restrictions.  Minimal discomfort of the plantar fashion today.  No pain on the course of plantar fashion the arch of the foot.  No pain with Achilles tendon.  MMT 5/5. No pain with calf compression, swelling, warmth, erythema  Assessment: 70 year old female with symptomatic onychomycosis, Plantar fasciitis, capsulitis,   Plan: -All treatment options discussed with the patient including all alternatives, risks, complications.  -I sharply debrided the nails x10 without any complications or bleeding. -Continue stretching, icing daily.  Continue with shoes and good arch support.  I dispensed a Tri-Lock ankle brace today as well.  Consider physical therapy if needed. - -Patient  encouraged to call the office with any questions, concerns, change in symptoms.   Trula Slade DPM

## 2020-12-23 ENCOUNTER — Encounter: Payer: Self-pay | Admitting: Podiatry

## 2020-12-23 ENCOUNTER — Ambulatory Visit (INDEPENDENT_AMBULATORY_CARE_PROVIDER_SITE_OTHER): Payer: Medicare Other | Admitting: Podiatry

## 2020-12-23 ENCOUNTER — Other Ambulatory Visit: Payer: Self-pay

## 2020-12-23 DIAGNOSIS — M79674 Pain in right toe(s): Secondary | ICD-10-CM | POA: Diagnosis not present

## 2020-12-23 DIAGNOSIS — B351 Tinea unguium: Secondary | ICD-10-CM | POA: Diagnosis not present

## 2020-12-23 DIAGNOSIS — M79675 Pain in left toe(s): Secondary | ICD-10-CM

## 2020-12-23 DIAGNOSIS — M722 Plantar fascial fibromatosis: Secondary | ICD-10-CM | POA: Diagnosis not present

## 2020-12-23 DIAGNOSIS — M7751 Other enthesopathy of right foot: Secondary | ICD-10-CM

## 2020-12-23 DIAGNOSIS — M19079 Primary osteoarthritis, unspecified ankle and foot: Secondary | ICD-10-CM

## 2020-12-23 NOTE — Progress Notes (Signed)
°  Subjective:  Patient ID: Kelly Rosales, female    DOB: 03-08-1950,   MRN: [id number]  Chief Complaint  Patient presents with   Nail Problem     3 months Nail trim/foot pain (bilateral )    70 y.o. female presents for routine nail care and bilateral foot pain. Has been in the care of Dr. Jacqualyn Posey for foot pain and has tried different bracing. Relates the ankle brace she received last has been helpful and would like another for the other ankle so she doesn't have to keep switching. Relates shooting pains that go up her leg. She does have tingling an numbness in her feet. She has a history of peripheral neuropathy secondary to lumbar radiculopathy.  . Denies any other pedal complaints. Denies n/v/f/c.   Past Medical History:  Diagnosis Date   Achilles tendonitis 10/2010   Ankle deformity 10/2010   Chronic abdominal pain    Chronic back pain    Chronic leg pain    left   Chronic neck pain    Fibromyalgia    Hypertension    IBS (irritable bowel syndrome)    Lumbar radiculopathy 10/2010   Peripheral neuropathy 10/2010   Secondary to Lumbar Radiculopathy    Objective:  Physical Exam: Vascular: DP/PT pulses 2/4 bilateral. CFT <3 seconds. Normal hair growth on digits. No edema.  Skin. No lacerations or abrasions bilateral feet. Nails 1-5 are thickened discolored and elongated with subungual debris.   Musculoskeletal: MMT 5/5 bilateral lower extremities in DF, PF, Inversion and Eversion. Deceased ROM in DF of ankle joint. Tender to dorsal midfoot bilateral and anterior ankle joint line. Also has some tenderness to plantar heel.  Neurological: Sensation intact to light touch.   Assessment:   1. Dermatophytosis of nail   2. Pain in toes of both feet   3. Plantar fasciitis   4. Arthritis of midfoot   5. Capsulitis of ankle, right      Plan:  Patient was evaluated and treated and all questions answered. -Discussed and educated patient onfoot care, especially with  regards to  the vascular, neurological and musculoskeletal systems.  -Discussed supportive shoes at all times and checking feet regularly.  -Mechanically debrided all nails 1-5 bilateral using sterile nail nipper and filed with dremel without incident  -Additional tri lock ankle brace dispensed.  -Answered all patient questions -Patient to return  in 3 months for at risk foot care -Patient advised to call the office if any problems or questions arise in the meantime.   Lorenda Peck, DPM

## 2020-12-26 ENCOUNTER — Emergency Department
Admission: EM | Admit: 2020-12-26 | Discharge: 2020-12-26 | Disposition: A | Discharge status: Home / Self Care | Payer: Medicare Other | Source: Home / Self Care

## 2020-12-26 ENCOUNTER — Other Ambulatory Visit: Payer: Self-pay

## 2020-12-26 ENCOUNTER — Telehealth: Payer: Self-pay

## 2020-12-26 DIAGNOSIS — M722 Plantar fascial fibromatosis: Secondary | ICD-10-CM | POA: Diagnosis not present

## 2020-12-26 MED ORDER — DICLOFENAC SODIUM 50 MG PO TBEC: 50.0000 mg | DELAYED_RELEASE_TABLET | Freq: Two times a day (BID) | ORAL | 0 refills | Status: AC

## 2020-12-26 NOTE — Discharge Instructions (Addendum)
Symptoms consistent with plantar fasciitis. Take medication as prescribed - do not take ibuprofen or Aleve with this, but you can still take Tylenol. Recommend gentle stretching as rolling your foot on a frozen water bottle. Follow-up with podiatrist or orthopedics if minimal improvement.

## 2020-12-26 NOTE — ED Provider Notes (Signed)
[name]    CSN: [id number] Arrival date & time: 12/26/20  1013      History   Chief Complaint Chief Complaint  Patient presents with   Foot Pain    bilat    HPI Kelly Rosales is a 70 y.o. female.   Patient presents with concerns of bilateral foot pain. She reports this has been going on for a while but seems to be getting worse. She states she does a lot of walking and this seems to worsen the pain. The patient reports pain in the arch area when she steps/puts weight onto her foot. She has the same pain in both feet, worse on the right. She has tried soaking without much improvement. She denies any injury.  She has been seeing a podiatrist for her feet but states they haven't given her anything to try to help with the pain. She does report she is under a lot of stress at home and thinks this is worsening her pain as well.   The history is provided by the patient.  Foot Pain   Past Medical History:  Diagnosis Date   Achilles tendonitis 10/2010   Ankle deformity 10/2010   Chronic abdominal pain    Chronic back pain    Chronic leg pain    left   Chronic neck pain    Fibromyalgia    Hypertension    IBS (irritable bowel syndrome)    Lumbar radiculopathy 10/2010   Peripheral neuropathy 10/2010   Secondary to Lumbar Radiculopathy    Patient Active Problem List   Diagnosis Date Noted   Pain, numbness, tingling of left foot 07/05/2020   Fibromyalgia 04/25/2020   Prediabetes 02/05/2020   Type 2 diabetes mellitus without complication, without long-term current use of insulin ([place]) 07/08/2018   Benign essential hypertension 01/17/2018   Class 1 obesity due to excess calories with serious comorbidity and body mass index (BMI) of 33.0 to 33.9 in adult 01/17/2018   Uterine fibroid 09/18/2014   HYPERLIPIDEMIA 02/10/2009   IRRITABLE BOWEL SYNDROME 02/10/2009   COLONIC POLYPS, HYPERPLASTIC, HX OF 02/10/2009   ABDOMINAL PAIN, GENERALIZED 12/07/2008     Past Surgical History:  Procedure Laterality Date   [name] Bunionectomy Left 2003   Cotton Osteotomy w/ graft Left 04/10/12   FOOT SURGERY     Nail Matrixectomy Left 04/10/12   TAL Lengthening Left 04/10/2012    OB History   No obstetric history on file.      Home Medications    Prior to Admission medications   Medication Sig Start Date End Date Taking? Authorizing Provider  diclofenac (VOLTAREN) 50 MG EC tablet Take 1 tablet (50 mg total) by mouth 2 (two) times daily. 12/26/20  Yes Abner Greenspan, Sundy Houchins L, PA    Family History Family History  Problem Relation Age of Onset   Cancer Mother    Diabetes Mother    Heart disease Mother    Hyperlipidemia Mother    Hypertension Mother     Social History Social History   Tobacco Use   Smoking status: Never   Smokeless tobacco: Never  Vaping Use   Vaping Use: Never used  Substance Use Topics   Alcohol use: No   Drug use: No     Allergies   Demerol [meperidine], Morphine and related, and Penicillins   Review of Systems Review of Systems  Constitutional:  Negative for fatigue and fever.  Cardiovascular:  Negative for leg swelling.  Musculoskeletal:  Positive for arthralgias and  gait problem. Negative for back pain.  Skin:  Negative for rash and wound.  Neurological:  Negative for weakness and numbness.    Physical Exam Triage Vital Signs ED Triage Vitals  Enc Vitals Group     BP 12/26/20 1034 (!) 176/99     Pulse Rate 12/26/20 1034 76     Resp 12/26/20 1034 14     Temp 12/26/20 1034 98.5 F (36.9 C)     Temp Source 12/26/20 1034 Oral     SpO2 12/26/20 1034 (!) 76 %     Weight --      Height --      Head Circumference --      Peak Flow --      Pain Score 12/26/20 1035 7     Pain Loc --      Pain Edu? --      Excl. in [place]? --    No data found.  Updated Vital Signs BP (!) 176/99 (BP Location: Left Arm)    Pulse 76    Temp 98.5 F (36.9 C) (Oral)    Resp 14    SpO2 96%   Visual Acuity Right Eye Distance:    Left Eye Distance:   Bilateral Distance:    Right Eye Near:   Left Eye Near:    Bilateral Near:     Physical Exam Vitals and nursing note reviewed.  Constitutional:      General: She is not in acute distress. HENT:     Head: Normocephalic.  Eyes:     Pupils: Pupils are equal, round, and reactive to light.  Cardiovascular:     Rate and Rhythm: Normal rate and regular rhythm.     Heart sounds: Normal heart sounds.  Pulmonary:     Effort: Pulmonary effort is normal.     Breath sounds: Normal breath sounds.  Musculoskeletal:     Right foot: Normal capillary refill. Tenderness present. No bony tenderness.     Left foot: Normal capillary refill. Tenderness present. No bony tenderness.     Comments: Bilateral feet with tenderness across arch/midfoot on the sole, worst approx midline over plantar fascia. Increased pain at first weightbearing step. No dorsal tenderness or tenderness into heel or distal foot. Full ROM.   Skin:    General: Skin is warm.     Findings: No bruising, erythema or rash.  Neurological:     Mental Status: She is alert.     Gait: Gait abnormal (antalgic).  Psychiatric:        Mood and Affect: Mood normal.     UC Treatments / Results  Labs (all labs ordered are listed, but only abnormal results are displayed) Labs Reviewed - No data to display  EKG   Radiology No results found.  Procedures Procedures (including critical care time)  Medications Ordered in UC Medications - No data to display  Initial Impression / Assessment and Plan / UC Course  I have reviewed the triage vital signs and the nursing notes.  Pertinent labs & imaging results that were available during my care of the patient were reviewed by me and considered in my medical decision making (see chart for details).     S/s consistent with chronic plantar fasciitis, likely worsened due to patient walking a lot. NSAID and discussed stretching/icing and follow-up with podiatry or ortho  for ongoing management. No injury or acute change indicating need for imaging at this time.   E/M: 1 chronic illness with exacerbation, no  data, moderate risk due to prescription management  Final Clinical Impressions(s) / UC Diagnoses   Final diagnoses:  Plantar fasciitis, bilateral     Discharge Instructions      Symptoms consistent with plantar fasciitis. Take medication as prescribed - do not take ibuprofen or Aleve with this, but you can still take Tylenol. Recommend gentle stretching as rolling your foot on a frozen water bottle. Follow-up with podiatrist or orthopedics if minimal improvement.     ED Prescriptions     Medication Sig Dispense Auth. Provider   diclofenac (VOLTAREN) 50 MG EC tablet Take 1 tablet (50 mg total) by mouth 2 (two) times daily. 10 tablet Abner Greenspan, Giann Obara L, PA      PDMP not reviewed this encounter.   Delsa Sale, [place] 12/26/20 1112

## 2020-12-26 NOTE — ED Triage Notes (Signed)
Pt presents with bilateral foot pain for several month, NKI

## 2020-12-26 NOTE — Telephone Encounter (Signed)
Pt notified of provider recommendations. Pt states she will look in medicine cabinet to see if she can sake suggested naproxen and would call this nurse back. Pt notified of tylenol if unable to take naproxen.

## 2021-02-27 ENCOUNTER — Ambulatory Visit: Payer: Medicare Other | Admitting: Podiatry

## 2021-02-27 ENCOUNTER — Other Ambulatory Visit: Payer: Self-pay

## 2021-02-27 ENCOUNTER — Encounter: Payer: Self-pay | Admitting: Podiatry

## 2021-02-27 DIAGNOSIS — M7662 Achilles tendinitis, left leg: Secondary | ICD-10-CM

## 2021-02-27 DIAGNOSIS — M79675 Pain in left toe(s): Secondary | ICD-10-CM

## 2021-02-27 DIAGNOSIS — M722 Plantar fascial fibromatosis: Secondary | ICD-10-CM

## 2021-02-27 DIAGNOSIS — B351 Tinea unguium: Secondary | ICD-10-CM

## 2021-02-27 DIAGNOSIS — M7661 Achilles tendinitis, right leg: Secondary | ICD-10-CM | POA: Diagnosis not present

## 2021-02-27 DIAGNOSIS — M79674 Pain in right toe(s): Secondary | ICD-10-CM

## 2021-02-27 MED ORDER — DICLOFENAC SODIUM 1 % EX GEL: 2.0000 g | Freq: Four times a day (QID) | CUTANEOUS | 2 refills | Status: AC

## 2021-02-27 NOTE — Patient Instructions (Signed)

## 2021-03-04 NOTE — Progress Notes (Signed)
Subjective: 71 year old female presents the office today for concerns of pain to the back of both of her legs.  This started after she had a fall in January.  She was seen in urgent care at the time.  No significant pain to the feet.  She does get some burning to the feet.  She has a history of peripheral neuropathy secondary lumbar radiculopathy.  Also asked for the nails be trimmed today as are thickened elongated she cannot trim them herself.  No open sores.  Objective: AAO x3, NAD DP/PT pulses palpable bilaterally, CRT less than 3 seconds Nails are hypertrophic, dystrophic, brittle, discolored, elongated 10. No surrounding redness or drainage. Tenderness nails 1-5 bilaterally. No open lesions or pre-ulcerative lesions are identified today. I am not able to elicit any area of pinpoint tenderness there is no significant edema present to the feet.  There is no open sores.  Flexor, extensor tendons appear to be intact.  There is no significant pain to the calfs today and the calves are supple and there is no edema, erythema or any signs of blood clots.  Upon palpation of the plantar aspect of the calcaneus there is mild discomfort along the plantar fascia.  Negative no deficits noted along the Achilles tendon.  Tinel sign.  Thompson test negative. No pain with calf compression, swelling, warmth, erythema  Assessment: 71 year old female with symptomatic onychosis, plantar fasciitis, tendinitis  Plan: -All treatment options discussed with the patient including all alternatives, risks, complications.  -Sharply debrided the nails x10 without any complications or bleeding. -Regards to her pain I referred her to physical therapy.  Discussed stretching, icing daily that she can do at home as well and discussed supportive shoe gear.  If symptoms continue x-ray versus MRI. -Patient encouraged to call the office with any questions, concerns, change in symptoms.   Trula Slade DPM

## 2021-03-10 ENCOUNTER — Other Ambulatory Visit: Payer: Self-pay

## 2021-03-10 ENCOUNTER — Ambulatory Visit: Payer: Medicare Other | Attending: Podiatry | Admitting: Physical Therapy

## 2021-03-10 ENCOUNTER — Encounter: Payer: Self-pay | Admitting: Physical Therapy

## 2021-03-10 DIAGNOSIS — M6281 Muscle weakness (generalized): Secondary | ICD-10-CM | POA: Insufficient documentation

## 2021-03-10 DIAGNOSIS — M722 Plantar fascial fibromatosis: Secondary | ICD-10-CM | POA: Diagnosis not present

## 2021-03-10 DIAGNOSIS — R262 Difficulty in walking, not elsewhere classified: Secondary | ICD-10-CM | POA: Insufficient documentation

## 2021-03-10 DIAGNOSIS — M79662 Pain in left lower leg: Secondary | ICD-10-CM | POA: Diagnosis present

## 2021-03-10 NOTE — Therapy (Signed)
[place], [place], [address] Phone: [phone]   Fax:  [phone]  Physical Therapy Evaluation  Patient Details  Name: Kelly Rosales MRN: [id number] Date of Birth: Aug 05, 1950 Referring Provider (PT): [place], [place]   Encounter Date: 03/10/2021   PT End of Session - 03/10/21 1051     Visit Number 1    Number of Visits 12    Date for PT Re-Evaluation 04/21/21    Authorization Type BCBS Medicare    Authorization - Visit Number 1    Progress Note Due on Visit 10    PT Start Time 1007    PT Stop Time 1049    PT Time Calculation (min) 42 min    Equipment Utilized During Treatment Gait belt    Activity Tolerance Patient limited by pain    Behavior During Therapy [facility] for tasks assessed/performed             Past Medical History:  Diagnosis Date   Achilles tendonitis 10/2010   Ankle deformity 10/2010   Chronic abdominal pain    Chronic back pain    Chronic leg pain    left   Chronic neck pain    Fibromyalgia    Hypertension    IBS (irritable bowel syndrome)    Lumbar radiculopathy 10/2010   Peripheral neuropathy 10/2010   Secondary to Lumbar Radiculopathy    Past Surgical History:  Procedure Laterality Date   [name] Bunionectomy Left 2003   Cotton Osteotomy w/ graft Left 04/10/12   FOOT SURGERY     Nail Matrixectomy Left 04/10/12   TAL Lengthening Left 04/10/2012    There were no vitals filed for this visit.    Subjective Assessment - 03/10/21 1005     Subjective Pt states she is having Lt calf pain that radiates down to her foot. She states pain began when she fell in January 2022. Pt states she has been trying to stretch and walk but the pain seems to be getting worse. Pt states that pain occasionally wakes her at night and sometimes makes her feel like her leg will "give out". She also reports cramping in her Lt calf. Pain comes and goes and states she has found no certain  aggrivating or relieving factors.    Pertinent History Fall 01/2020    Patient Stated Goals decrease pain    Currently in Pain? Yes    Pain Score 7     Pain Location Leg    Pain Orientation Left    Pain Descriptors / Indicators Sharp    Pain Onset More than a month ago    Pain Frequency Constant    Aggravating Factors  unknown    Pain Relieving Factors unknown                [facility] PT Assessment - 03/10/21 0001       Assessment   Medical Diagnosis plantar fasciitis    Referring Provider (PT) Jacqualyn Posey, MATTHEW    Onset Date/Surgical Date 01/11/20    Next MD Visit 04/10/21    Prior Therapy no      Precautions   Precautions None      Restrictions   Weight Bearing Restrictions No      Balance Screen   Has the patient fallen in the past 6 months Yes    How many times? 1    Has the patient had a decrease in activity level because of a fear of falling?  No    Is the patient reluctant to leave their home because of a fear of falling?  No      Prior Function   Level of Independence Independent      Observation/Other Assessments   Focus on Therapeutic Outcomes (FOTO)  not assessed      Sensation   Additional Comments hypersesitive to light pressure Lt calf and anterior lower leg      Functional Tests   Functional tests --   unable to tolerate SLS on Lt LE     ROM / Strength   AROM / PROM / Strength AROM;Strength      AROM   AROM Assessment Site Ankle    Right/Left Ankle Right;Left    Right Ankle Dorsiflexion -5    Right Ankle Plantar Flexion 50    Right Ankle Inversion 20    Right Ankle Eversion 30    Left Ankle Dorsiflexion -11    Left Ankle Plantar Flexion 33    Left Ankle Inversion 11    Left Ankle Eversion 20      Strength   Strength Assessment Site Knee;Ankle    Right/Left Knee Left    Left Knee Flexion 4-/5    Left Knee Extension 4/5    Right/Left Ankle Right;Left    Right Ankle Dorsiflexion 4-/5    Right Ankle Plantar Flexion 4/5    Right Ankle  Inversion 4-/5    Right Ankle Eversion 4-/5    Left Ankle Dorsiflexion 4/5    Left Ankle Plantar Flexion 4-/5    Left Ankle Inversion 3+/5    Left Ankle Eversion 3/5      Palpation   Palpation comment hypomoble A/P mobs TC jt, hypomobile midfoot and great toe      Ambulation/Gait   Gait Comments antalgic with decreased Lt stance time                        Objective measurements completed on examination: See above findings.       [place] Adult PT Treatment/Exercise - 03/10/21 0001       Exercises   Exercises Ankle      Modalities   Modalities Electrical Stimulation;Moist Heat      Moist Heat Therapy   Number Minutes Moist Heat 10 Minutes    Moist Heat Location --   Left lower leg     Electrical Stimulation   Electrical Stimulation Location left calf    Electrical Stimulation Action TENS    Electrical Stimulation Parameters to tolerance    Electrical Stimulation Goals Pain      Ankle Exercises: Stretches   Other Stretch eversion/inversion stretch 3 x 10 sec      Ankle Exercises: Seated   Ankle Circles/Pumps 10 reps    Towel Inversion/Eversion Limitations 10 reps    Heel Raises 10 reps    Toe Raise 10 reps                     PT Education - 03/10/21 1050     Education Details PT POC and goals, HEP    Person(s) Educated Patient    Methods Explanation;Demonstration;Handout    Comprehension Returned demonstration;Verbalized understanding                 PT Long Term Goals - 03/10/21 1055       PT LONG TERM GOAL #1   Title Pt will be independent in HEP    Time  6    Period Weeks    Status New    Target Date 04/21/21      PT LONG TERM GOAL #2   Title Pt will improve Lt ankle DF to neutral to improve ambulation    Time 6    Period Weeks    Status New    Target Date 04/21/21      PT LONG TERM GOAL #3   Title Pt will improve Lt ankle strength to 4/5 to improve standing and gait tolerance    Time 6    Period Weeks     Status New    Target Date 04/21/21      PT LONG TERM GOAL #4   Title Pt will sleep x 6 hours without being woken by Lt LE pain    Time 6    Period Weeks    Status New    Target Date 04/21/21                    Plan - 03/10/21 1051     Clinical Impression Statement Pt is a 71 y/o female referred for plantar fasciitis. Pt presents with Lt lower leg pain and hypersensitivity, Decreased ankle strength and ROM, decreased activity tolerance, impaired gait and balance. Pt will benefit from skilled PT to address deficits and improve functional mobiltiy.    Personal Factors and Comorbidities Time since onset of injury/illness/exacerbation;Age    Examination-Activity Limitations Sleep;Stand;Stairs;Locomotion Level    Examination-Participation Restrictions Cleaning;Community Activity;Volunteer    Stability/Clinical Decision Making Evolving/Moderate complexity    Clinical Decision Making Moderate    Rehab Potential Good    PT Frequency 2x / week    PT Duration 6 weeks    PT Treatment/Interventions Vasopneumatic Device;Dry needling;Taping;Manual techniques;Patient/family education;Therapeutic activities;Therapeutic exercise;Balance training;Neuromuscular re-education;Iontophoresis 4mg /ml Dexamethasone;Aquatic Therapy;Cryotherapy;Moist Heat;Electrical Stimulation    PT Next Visit Plan assess HEP, progress ankle mobility and strength    PT Home Exercise Plan [phone]    Consulted and Agree with Plan of Care Patient             Patient will benefit from skilled therapeutic intervention in order to improve the following deficits and impairments:  Pain, Decreased activity tolerance, Decreased range of motion, Impaired sensation, Hypomobility, Difficulty walking, Decreased balance  Visit Diagnosis: Muscle weakness (generalized) - Plan: PT plan of care cert/re-cert  Pain in left lower leg - Plan: PT plan of care cert/re-cert  Difficulty in walking, not elsewhere classified - Plan: PT  plan of care cert/re-cert     Problem List Patient Active Problem List   Diagnosis Date Noted   Pain, numbness, tingling of left foot 07/05/2020   Fibromyalgia 04/25/2020   Prediabetes 02/05/2020   Type 2 diabetes mellitus without complication, without long-term current use of insulin ([place]) 07/08/2018   Benign essential hypertension 01/17/2018   Class 1 obesity due to excess calories with serious comorbidity and body mass index (BMI) of 33.0 to 33.9 in adult 01/17/2018   Uterine fibroid 09/18/2014   HYPERLIPIDEMIA 02/10/2009   IRRITABLE BOWEL SYNDROME 02/10/2009   COLONIC POLYPS, HYPERPLASTIC, HX OF 02/10/2009   ABDOMINAL PAIN, GENERALIZED 12/07/2008    Norleen Xie, PT 03/10/2021, 11:13 AM  [facility] [place], [place], [address] Phone: [phone]   Fax:  [phone]  Name: Kelly Rosales MRN: [id number] Date of Birth: March 29, 1950

## 2021-03-10 NOTE — Patient Instructions (Signed)
Access Code: [email or web address] ?URL: [email or web address] ?Date: 03/10/2021 ?Prepared by: Isabelle Course ? ?Exercises ?Ankle Inversion Eversion Towel Slide - 1 x daily - 7 x weekly - 2 sets - 10 reps ?Seated Ankle Inversion Eversion PROM - 1 x daily - 7 x weekly - 2 sets - 10 reps ?Seated Ankle Circles - 1 x daily - 7 x weekly - 2 sets - 10 reps ?Seated Heel Toe Raises - 1 x daily - 7 x weekly - 2 sets - 10 reps ? ?

## 2021-03-17 ENCOUNTER — Other Ambulatory Visit: Payer: Self-pay

## 2021-03-17 ENCOUNTER — Ambulatory Visit: Payer: Medicare Other | Admitting: Physical Therapy

## 2021-03-17 DIAGNOSIS — M6281 Muscle weakness (generalized): Secondary | ICD-10-CM

## 2021-03-17 DIAGNOSIS — R262 Difficulty in walking, not elsewhere classified: Secondary | ICD-10-CM

## 2021-03-17 DIAGNOSIS — M79662 Pain in left lower leg: Secondary | ICD-10-CM

## 2021-03-17 NOTE — Therapy (Signed)
[place], [place], [address] Phone: [phone]   Fax:  [phone]  Physical Therapy Treatment  Patient Details  Name: Kelly Rosales MRN: [id number] Date of Birth: 1950/06/20 Referring Provider (PT): [place], [place]   Encounter Date: 03/17/2021   PT End of Session - 03/17/21 2536     Visit Number 2    Number of Visits 12    Date for PT Re-Evaluation 04/21/21    Authorization Type [employer] Medicare    Authorization - Visit Number 2    Progress Note Due on Visit 10    PT Start Time 0900   pt arrived late   PT Stop Time 0927    PT Time Calculation (min) 27 min    Activity Tolerance Patient tolerated treatment well    Behavior During Therapy [facility] for tasks assessed/performed             Past Medical History:  Diagnosis Date   Achilles tendonitis 10/2010   Ankle deformity 10/2010   Chronic abdominal pain    Chronic back pain    Chronic leg pain    left   Chronic neck pain    Fibromyalgia    Hypertension    IBS (irritable bowel syndrome)    Lumbar radiculopathy 10/2010   Peripheral neuropathy 10/2010   Secondary to Lumbar Radiculopathy    Past Surgical History:  Procedure Laterality Date   [name] Bunionectomy Left 2003   Cotton Osteotomy w/ graft Left 04/10/12   FOOT SURGERY     Nail Matrixectomy Left 04/10/12   TAL Lengthening Left 04/10/2012    There were no vitals filed for this visit.   Subjective Assessment - 03/17/21 0901     Subjective Pt states she "had a rough night last night" and is in more pain today. Also more pain due to rain today    Patient Stated Goals decrease pain    Currently in Pain? Yes    Pain Score 9     Pain Location Foot    Pain Orientation Left    Pain Descriptors / Indicators Sharp;Sore                               [facility] Adult PT Treatment/Exercise - 03/17/21 0001       Self-Care   Self-Care Other Self-Care Comments     Other Self-Care Comments  self massage with tennis ball and rolling pin      Manual Therapy   Manual Therapy Soft tissue mobilization;Joint mobilization    Joint Mobilization metatarsal and navicular mobs to improve midfoot/forefoot mobility    Soft tissue mobilization STM plantar fascia to reduce pain and tightness      Ankle Exercises: Aerobic   Nustep L5 x 3 min for warm up      Ankle Exercises: Seated   Ankle Circles/Pumps 10 reps    Towel Crunch 2 reps   30 sec   Towel Inversion/Eversion Limitations 20 reps    Other Seated Ankle Exercises rocker board DF/PF 2 x 1 min    Other Seated Ankle Exercises toe extension 2 x 10      Ankle Exercises: Stretches   Gastroc Stretch 3 reps;20 seconds   seated with strap                    PT Education - 03/17/21 0927     Education Details self  massage with tennis ball and rolling pin    Person(s) Educated Patient    Methods Explanation;Demonstration    Comprehension Verbalized understanding;Returned demonstration                 PT Long Term Goals - 03/10/21 1055       PT LONG TERM GOAL #1   Title Pt will be independent in HEP    Time 6    Period Weeks    Status New    Target Date 04/21/21      PT LONG TERM GOAL #2   Title Pt will improve Lt ankle DF to neutral to improve ambulation    Time 6    Period Weeks    Status New    Target Date 04/21/21      PT LONG TERM GOAL #3   Title Pt will improve Lt ankle strength to 4/5 to improve standing and gait tolerance    Time 6    Period Weeks    Status New    Target Date 04/21/21      PT LONG TERM GOAL #4   Title Pt will sleep x 6 hours without being woken by Lt LE pain    Time 6    Period Weeks    Status New    Target Date 04/21/21                   Plan - 03/17/21 5366     Clinical Impression Statement Pt better able to tolerate exercise and manual work this visit. PT educated on importance of performing stretching and mobility at home.  Educated pt on self massage with tennis ball and rolling pin. Pt will benefit from continued mobility, stretching and strengthening of foot and ankle    PT Next Visit Plan progress HEP, ankle/foot mobility and strength    PT Home Exercise Plan [phone]    Consulted and Agree with Plan of Care Patient             Patient will benefit from skilled therapeutic intervention in order to improve the following deficits and impairments:     Visit Diagnosis: Muscle weakness (generalized)  Pain in left lower leg  Difficulty in walking, not elsewhere classified     Problem List Patient Active Problem List   Diagnosis Date Noted   Pain, numbness, tingling of left foot 07/05/2020   Fibromyalgia 04/25/2020   Prediabetes 02/05/2020   Type 2 diabetes mellitus without complication, without long-term current use of insulin ([place]) 07/08/2018   Benign essential hypertension 01/17/2018   Class 1 obesity due to excess calories with serious comorbidity and body mass index (BMI) of 33.0 to 33.9 in adult 01/17/2018   Uterine fibroid 09/18/2014   HYPERLIPIDEMIA 02/10/2009   IRRITABLE BOWEL SYNDROME 02/10/2009   COLONIC POLYPS, HYPERPLASTIC, HX OF 02/10/2009   ABDOMINAL PAIN, GENERALIZED 12/07/2008    Cayde Held, PT 03/17/2021, 9:30 AM  [facility] [address], [place], [address] Phone: [phone]   Fax:  [phone]  Name: Kelly Rosales MRN: [id number] Date of Birth: 01-Aug-1950

## 2021-03-24 ENCOUNTER — Ambulatory Visit: Payer: Medicare Other | Admitting: Podiatry

## 2021-03-24 ENCOUNTER — Ambulatory Visit: Payer: Medicare Other | Admitting: Physical Therapy

## 2021-03-31 ENCOUNTER — Ambulatory Visit: Payer: Medicare Other | Admitting: Physical Therapy

## 2021-04-07 ENCOUNTER — Encounter: Payer: Medicare Other | Admitting: Physical Therapy

## 2021-04-07 ENCOUNTER — Emergency Department (INDEPENDENT_AMBULATORY_CARE_PROVIDER_SITE_OTHER)
Admission: EM | Admit: 2021-04-07 | Discharge: 2021-04-07 | Disposition: A | Discharge status: Home / Self Care | Payer: Medicare Other | Source: Home / Self Care | Attending: Family Medicine | Admitting: Family Medicine

## 2021-04-07 ENCOUNTER — Emergency Department (INDEPENDENT_AMBULATORY_CARE_PROVIDER_SITE_OTHER): Payer: Medicare Other

## 2021-04-07 DIAGNOSIS — M5410 Radiculopathy, site unspecified: Secondary | ICD-10-CM

## 2021-04-07 DIAGNOSIS — M549 Dorsalgia, unspecified: Secondary | ICD-10-CM

## 2021-04-07 DIAGNOSIS — M5416 Radiculopathy, lumbar region: Secondary | ICD-10-CM

## 2021-04-07 MED ORDER — NAPROXEN 500 MG PO TABS: 500.0000 mg | ORAL_TABLET | Freq: Two times a day (BID) | ORAL | 1 refills | Status: AC

## 2021-04-07 MED ORDER — PREDNISONE 20 MG PO TABS: ORAL_TABLET | ORAL | 0 refills | Status: DC

## 2021-04-07 NOTE — ED Triage Notes (Signed)
Pt states that she has some back pain that radiates down both legs and feet. X1 week ?

## 2021-04-07 NOTE — Discharge Instructions (Signed)
Apply ice pack to your lower mid-back for 20 to 30 minutes, 3 to 4 times daily  Continue until pain decreases.  ?

## 2021-04-07 NOTE — ED Provider Notes (Signed)
?[facility] ? ? ? ?CSN: [id number] ?Arrival date & time: 04/07/21  1708 ? ? ?  ? ?History   ?Chief Complaint ?Chief Complaint  ?Patient presents with  ? Back Pain  ?  Back pain that radiates down both legs and feet. X1 week  ? ? ?HPI ?Kelly Rosales is a 71 y.o. female.  ? ?Patient reports that six days ago she developed recurrent lower back pain radiating down both posterior legs to her feet, somewhat worse on her left.  She recalls no recent injury or change in activities.  She denies bowel or bladder dysfunction, and no saddle numbness.  Her pain is worse when standing/walking and somewhat improved when flexing forward. ?  ? ?The history is provided by the patient.  ?Back Pain ?Location:  Lumbar spine ?Quality:  Aching ?Radiates to:  L posterior upper leg ?Pain severity:  Moderate ?Pain is:  Worse during the day ?Onset quality:  Sudden ?Duration:  6 days ?Timing:  Constant ?Progression:  Unchanged ?Chronicity:  Recurrent ?Context: not falling, not lifting heavy objects, not MVA, not recent illness and not recent injury   ?Relieved by:  Being still ?Worsened by:  Ambulation and movement ?Ineffective treatments:  None tried ?Associated symptoms: no abdominal pain, no bladder incontinence, no bowel incontinence, no dysuria, no fever, no leg pain, no numbness, no paresthesias, no pelvic pain, no perianal numbness and no weakness   ? ?Past Medical History:  ?Diagnosis Date  ? Achilles tendonitis 10/2010  ? Ankle deformity 10/2010  ? Chronic abdominal pain   ? Chronic back pain   ? Chronic leg pain   ? left  ? Chronic neck pain   ? Fibromyalgia   ? Hypertension   ? IBS (irritable bowel syndrome)   ? Lumbar radiculopathy 10/2010  ? Peripheral neuropathy 10/2010  ? Secondary to Lumbar Radiculopathy  ? ? ?Patient Active Problem List  ? Diagnosis Date Noted  ? Pain, numbness, tingling of left foot 07/05/2020  ? Fibromyalgia 04/25/2020  ? Prediabetes 02/05/2020  ? Type 2 diabetes mellitus without  complication, without long-term current use of insulin ([place]) 07/08/2018  ? Benign essential hypertension 01/17/2018  ? Class 1 obesity due to excess calories with serious comorbidity and body mass index (BMI) of 33.0 to 33.9 in adult 01/17/2018  ? Uterine fibroid 09/18/2014  ? HYPERLIPIDEMIA 02/10/2009  ? IRRITABLE BOWEL SYNDROME 02/10/2009  ? COLONIC POLYPS, HYPERPLASTIC, HX OF 02/10/2009  ? ABDOMINAL PAIN, GENERALIZED 12/07/2008  ? ? ?Past Surgical History:  ?Procedure Laterality Date  ? [place] Bunionectomy Left 2003  ? Cotton Osteotomy w/ graft Left 04/10/12  ? FOOT SURGERY    ? Nail Matrixectomy Left 04/10/12  ? TAL Lengthening Left 04/10/2012  ? ? ?OB History   ?No obstetric history on file. ?  ? ? ? ?Home Medications   ? ?Prior to Admission medications   ?Medication Sig Start Date End Date Taking? Authorizing Provider  ?diclofenac (VOLTAREN) 50 MG EC tablet Take 1 tablet (50 mg total) by mouth 2 (two) times daily. 12/26/20  Yes Arruda, Amy L, PA  ?diclofenac Sodium (VOLTAREN) 1 % GEL Apply 2 g topically 4 (four) times daily. Rub into affected area of foot 2 to 4 times daily 02/27/21  Yes Trula Slade, DPM  ?ibuprofen (ADVIL) 800 MG tablet Take 800 mg by mouth 3 (three) times daily. 12/26/20  Yes [provider]  ?naproxen (NAPROSYN) 500 MG tablet Take 1 tablet (500 mg total) by mouth 2 (two) times  daily. (every 12 hours with food) 04/07/21  Yes Rohin Krejci, Ishmael Holter, MD  ? ? ?Family History ?Family History  ?Problem Relation Age of Onset  ? Cancer Mother   ? Diabetes Mother   ? Heart disease Mother   ? Hyperlipidemia Mother   ? Hypertension Mother   ? ? ?Social History ?Social History  ? ?Tobacco Use  ? Smoking status: Never  ? Smokeless tobacco: Never  ?Vaping Use  ? Vaping Use: Never used  ?Substance Use Topics  ? Alcohol use: No  ? Drug use: No  ? ? ? ?Allergies   ?Demerol [meperidine], Morphine and related, and Penicillins ? ? ?Review of Systems ?Review of Systems  ?Constitutional:  Negative for fever.   ?Gastrointestinal:  Negative for abdominal pain and bowel incontinence.  ?Genitourinary:  Negative for bladder incontinence, dysuria and pelvic pain.  ?Musculoskeletal:  Positive for back pain.  ?Skin:  Negative for rash.  ?Neurological:  Negative for weakness, numbness and paresthesias.  ?All other systems reviewed and are negative. ? ? ?Physical Exam ?Triage Vital Signs ?ED Triage Vitals  ?Enc Vitals Group  ?   BP 04/07/21 1731 (!) 162/51  ?   Pulse Rate 04/07/21 1731 70  ?   Resp 04/07/21 1731 18  ?   Temp 04/07/21 1731 97.9 ?F (36.6 ?C)  ?   Temp Source 04/07/21 1731 Oral  ?   SpO2 04/07/21 1731 95 %  ?   Weight 04/07/21 1727 190 lb (86.2 kg)  ?   Height 04/07/21 1727 '5\' 6"'$  (1.676 m)  ?   Head Circumference --   ?   Peak Flow --   ?   Pain Score 04/07/21 1727 10  ?   Pain Loc --   ?   Pain Edu? --   ?   Excl. in [place]? --   ? ?No data found. ? ?Updated Vital Signs ?BP (!) 162/51 (BP Location: Right Arm)   Pulse 70   Temp 97.9 ?F (36.6 ?C) (Oral)   Resp 18   Ht '5\' 6"'$  (1.676 m)   Wt 86.2 kg   SpO2 95%   BMI 30.67 kg/m?  ? ?Visual Acuity ?Right Eye Distance:   ?Left Eye Distance:   ?Bilateral Distance:   ? ?Right Eye Near:   ?Left Eye Near:    ?Bilateral Near:    ? ?Physical Exam ?Vitals and nursing note reviewed.  ?Constitutional:   ?   General: She is not in acute distress. ?HENT:  ?   Head: Normocephalic.  ?   Mouth/Throat:  ?   Pharynx: Oropharynx is clear.  ?Eyes:  ?   Pupils: Pupils are equal, round, and reactive to light.  ?Cardiovascular:  ?   Rate and Rhythm: Normal rate and regular rhythm.  ?   Heart sounds: Normal heart sounds.  ?Pulmonary:  ?   Breath sounds: Normal breath sounds.  ?Abdominal:  ?   Palpations: Abdomen is soft.  ?   Tenderness: There is no abdominal tenderness.  ?Musculoskeletal:  ?   Cervical back: Normal range of motion.  ?   Lumbar back: No swelling, spasms or bony tenderness. Decreased range of motion. Positive right straight leg raise test and positive left straight leg raise  test.  ?     Back: ? ?   Right lower leg: No edema.  ?   Left lower leg: No edema.  ?   Comments: Back:  Range of motion relatively well preserved.  Can heel/toe walk  and squat without difficulty.  Tenderness in the  left paraspinous muscles from L3 to Sacral area.  Straight leg raising test is positive on the left at about 5 degrees from horizontal, and 45 degrees on the right. Sitting knee extension test is positive on the left at about 45 degrees and 10 degrees from horizontal on the right. Strength and sensation in the lower extremities is normal.  Patellar reflexes are present.  Achilles reflexes are not elicited. ?  ?Skin: ?   General: Skin is warm and dry.  ?   Findings: No rash.  ?Neurological:  ?   General: No focal deficit present.  ?   Mental Status: She is alert.  ? ? ? ?UC Treatments / Results  ?Labs ?(all labs ordered are listed, but only abnormal results are displayed) ?Labs Reviewed - No data to display ? ?EKG ? ? ?Radiology ?DG Lumbar Spine Complete ? ?Result Date: 04/07/2021 ?CLINICAL DATA:  Back pain, radicular pain in both lower extremities EXAM: LUMBAR SPINE - COMPLETE 4+ VIEW COMPARISON:  07/22/2020 FINDINGS: There is mild levoscoliosis. No recent fracture is seen. Alignment of posterior margins of vertebral bodies is unremarkable. There are small bony spurs at multiple levels throughout lumbar spine. There is disc space narrowing at the L4-L5 level. Degenerative changes are noted in the facet joints, more so in the lower lumbar spine. Overall, no significant interval changes are noted. Small round calcific density in the right paraspinal region at L4 level may suggest vascular calcification. IMPRESSION: No recent fracture is seen. Degenerative changes are noted, more so in the lower lumbar spine. Electronically Signed   By: Elmer Picker M.D.   On: 04/07/2021 18:17   ? ?Procedures ?Procedures (including critical care time) ? ?Medications Ordered in UC ?Medications - No data to  display ? ?Initial Impression / Assessment and Plan / UC Course  ?I have reviewed the triage vital signs and the nursing notes. ? ?Pertinent labs & imaging results that were available during my care of the patient were reviewed by

## 2021-04-10 ENCOUNTER — Ambulatory Visit: Payer: Medicare Other | Admitting: Podiatry

## 2023-05-29 ENCOUNTER — Encounter: Payer: Self-pay | Admitting: Family Medicine

## 2023-05-29 ENCOUNTER — Ambulatory Visit
Admission: EM | Admit: 2023-05-29 | Discharge: 2023-05-29 | Disposition: A | Discharge status: Home / Self Care | Attending: Family Medicine | Admitting: Family Medicine

## 2023-05-29 DIAGNOSIS — M79604 Pain in right leg: Secondary | ICD-10-CM

## 2023-05-29 DIAGNOSIS — M79605 Pain in left leg: Secondary | ICD-10-CM

## 2023-05-29 MED ORDER — HYDROCODONE-ACETAMINOPHEN 5-325 MG PO TABS: 1.0000 | ORAL_TABLET | Freq: Three times a day (TID) | ORAL | 0 refills | Status: AC | PRN

## 2023-05-29 NOTE — ED Provider Notes (Signed)
 [facility]    CSN: [id number] Arrival date & time: 05/29/23  1919      History   Chief Complaint Chief Complaint  Patient presents with   Leg Swelling    HPI Kelly Rosales is a 73 y.o. female.   HPI 73 year old female presents with bilateral leg pain and swelling for 2 weeks.  PMH significant for chronic leg pain, fibromyalgia, and HTN.  Past Medical History:  Diagnosis Date   Achilles tendonitis 10/2010   Ankle deformity 10/2010   Chronic abdominal pain    Chronic back pain    Chronic leg pain    left   Chronic neck pain    Fibromyalgia    Hypertension    IBS (irritable bowel syndrome)    Lumbar radiculopathy 10/2010   Peripheral neuropathy 10/2010   Secondary to Lumbar Radiculopathy    Patient Active Problem List   Diagnosis Date Noted   Pain, numbness, tingling of left foot 07/05/2020   Fibromyalgia 04/25/2020   Prediabetes 02/05/2020   Type 2 diabetes mellitus without complication, without long-term current use of insulin ([facility]) 07/08/2018   Benign essential hypertension 01/17/2018   Class 1 obesity due to excess calories with serious comorbidity and body mass index (BMI) of 33.0 to 33.9 in adult 01/17/2018   Uterine fibroid 09/18/2014   HYPERLIPIDEMIA 02/10/2009   IRRITABLE BOWEL SYNDROME 02/10/2009   History of colonic polyps 02/10/2009   ABDOMINAL PAIN, GENERALIZED 12/07/2008    Past Surgical History:  Procedure Laterality Date   Kelly Rosales Bunionectomy Left 2003   Cotton Osteotomy w/ graft Left 04/10/12   FOOT SURGERY     Nail Matrixectomy Left 04/10/12   TAL Lengthening Left 04/10/2012    OB History   No obstetric history on file.      Home Medications    Prior to Admission medications   Medication Sig Start Date End Date Taking? Authorizing Provider  HYDROcodone -acetaminophen  (NORCO/VICODIN) 5-325 MG tablet Take 1 tablet by mouth every 8 (eight) hours as needed for up to 5 days. 05/29/23 06/03/23 Yes Kelly Ramp, FNP   diclofenac  (VOLTAREN ) 50 MG EC tablet Take 1 tablet (50 mg total) by mouth 2 (two) times daily. 12/26/20   Kelly Rosales, Kelly L, PA  diclofenac  Sodium (VOLTAREN ) 1 % GEL Apply 2 g topically 4 (four) times daily. Rub into affected area of foot 2 to 4 times daily 02/27/21   Kelly Rosales, DPM  ibuprofen  (ADVIL ) 800 MG tablet Take 800 mg by mouth 3 (three) times daily. 12/26/20   [provider]  naproxen  (NAPROSYN ) 500 MG tablet Take 1 tablet (500 mg total) by mouth 2 (two) times daily. (every 12 hours with food) 04/07/21   Beese, Lenis Quin, MD    Family History Family History  Problem Relation Age of Onset   Cancer Mother    Diabetes Mother    Heart disease Mother    Hyperlipidemia Mother    Hypertension Mother     Social History Social History   Tobacco Use   Smoking status: Never   Smokeless tobacco: Never  Vaping Use   Vaping status: Never Used  Substance Use Topics   Alcohol use: No   Drug use: No     Allergies   Kelly Rosales [meperidine], Kelly Rosales and codeine, and Penicillins   Review of Systems Review of Systems   Physical Exam Triage Vital Signs ED Triage Vitals  Encounter Vitals Group     BP      Systolic BP Percentile  Diastolic BP Percentile      Pulse      Resp      Temp      Temp src      SpO2      Weight      Height      Head Circumference      Peak Flow      Pain Score      Pain Loc      Pain Education      Exclude from Growth Chart    No data found.  Updated Vital Signs BP (!) 155/91   Pulse 81   Temp 97.7 F (36.5 C)   Resp 16   SpO2 98%    Physical Exam Vitals and nursing note reviewed.  Constitutional:      Appearance: Normal appearance. She is normal weight.  HENT:     Head: Normocephalic and atraumatic.     Mouth/Throat:     Mouth: Mucous membranes are moist.     Pharynx: Oropharynx is clear.  Eyes:     Extraocular Movements: Extraocular movements intact.     Conjunctiva/sclera: Conjunctivae normal.     Pupils:  Pupils are equal, round, and reactive to light.  Cardiovascular:     Rate and Rhythm: Normal rate and regular rhythm.     Pulses: Normal pulses.     Heart sounds: Normal heart sounds.  Pulmonary:     Effort: Pulmonary effort is normal.     Breath sounds: Normal breath sounds. No wheezing, rhonchi or rales.  Musculoskeletal:        General: Normal range of motion.     Cervical back: Normal range of motion and neck supple.     Comments: Patient reporting pain of bilateral upper and lower legs, exam limited today due to pain.  Skin:    General: Skin is warm and dry.  Neurological:     General: No focal deficit present.     Mental Status: She is alert and oriented to person, place, and time. Mental status is at baseline.  Psychiatric:        Mood and Affect: Mood normal.        Behavior: Behavior normal.      UC Treatments / Results  Labs (all labs ordered are listed, but only abnormal results are displayed) Labs Reviewed - No data to display  EKG   Radiology No results found.  Procedures Procedures (including critical care time)  Medications Ordered in UC Medications - No data to display  Initial Impression / Assessment and Plan / UC Course  I have reviewed the triage vital signs and the nursing notes.  Pertinent labs & imaging results that were available during my care of the patient were reviewed by me and considered in my medical decision making (see chart for details).     MDM: 1.  Bilateral leg pain-Rx'd Norco 5/325 mg tablet: Take 1 tablet every 8 hours, as needed for severe/acute bilateral leg pain. Advised patient to take medication as directed with food daily or as needed for bilateral leg pain.  Patient advised of sedative  effects of this medication.  Encouraged to increase daily water intake to 64 ounces per day while taking these medications.  Advised if symptoms worsen and/or unresolved please follow-up with your PCP or here for further evaluation.  Patient  discharged home, hemodynamically stable. Final Clinical Impressions(s) / UC Diagnoses   Final diagnoses:  Bilateral leg pain     Discharge Instructions  Advised patient to take medication as directed with food daily or as needed for bilateral leg pain.  Patient advised of sedative  effects of this medication.  Encouraged to increase daily water intake to 64 ounces per day while taking these medications.  Advised if symptoms worsen and/or unresolved please follow-up with your PCP or here for further evaluation.   ED Prescriptions     Medication Sig Dispense Auth. Provider   HYDROcodone -acetaminophen  (NORCO/VICODIN) 5-325 MG tablet Take 1 tablet by mouth every 8 (eight) hours as needed for up to 5 days. 15 tablet Jasiah Elsen, FNP      I have reviewed the PDMP during this encounter.   Kelly Ramp, FNP 05/29/23 2000

## 2023-05-29 NOTE — Discharge Instructions (Addendum)
 Advised patient to take medication as directed with food daily or as needed for bilateral leg pain.  Patient advised of sedative  effects of this medication.  Encouraged to increase daily water intake to 64 ounces per day while taking these medications.  Advised if symptoms worsen and/or unresolved please follow-up with your PCP or here for further evaluation.

## 2023-05-29 NOTE — ED Triage Notes (Signed)
 Pt presents to uc with co 2 weeks of bilateral leg swelling, pain and aches. Pt reports pain has worsened the last two weeks and it comes and goes. No history of htn, diabetes, heart failure. Reports not a smoker. Endorses cough.

## 2023-06-12 ENCOUNTER — Telehealth: Payer: Self-pay | Admitting: *Deleted

## 2023-06-12 ENCOUNTER — Other Ambulatory Visit: Payer: Self-pay

## 2023-06-12 ENCOUNTER — Encounter (HOSPITAL_BASED_OUTPATIENT_CLINIC_OR_DEPARTMENT_OTHER): Payer: Self-pay | Admitting: Emergency Medicine

## 2023-06-12 DIAGNOSIS — S80862A Insect bite (nonvenomous), left lower leg, initial encounter: Secondary | ICD-10-CM | POA: Diagnosis present

## 2023-06-12 DIAGNOSIS — W57XXXA Bitten or stung by nonvenomous insect and other nonvenomous arthropods, initial encounter: Secondary | ICD-10-CM | POA: Insufficient documentation

## 2023-06-12 NOTE — Telephone Encounter (Signed)
 I called and informed the patient that she needs to check in with us  tomorrow then we will direct her to imaging to get xrays.

## 2023-06-12 NOTE — ED Triage Notes (Signed)
 Pt here for tick removal from the back of LLE (behind knee) today

## 2023-06-13 ENCOUNTER — Emergency Department (HOSPITAL_BASED_OUTPATIENT_CLINIC_OR_DEPARTMENT_OTHER)
Admission: EM | Admit: 2023-06-13 | Discharge: 2023-06-13 | Disposition: A | Discharge status: Home / Self Care | Attending: Emergency Medicine | Admitting: Emergency Medicine

## 2023-06-13 ENCOUNTER — Ambulatory Visit

## 2023-06-13 ENCOUNTER — Encounter: Payer: Self-pay | Admitting: Podiatry

## 2023-06-13 ENCOUNTER — Ambulatory Visit (INDEPENDENT_AMBULATORY_CARE_PROVIDER_SITE_OTHER): Admitting: Podiatry

## 2023-06-13 DIAGNOSIS — M79671 Pain in right foot: Secondary | ICD-10-CM

## 2023-06-13 DIAGNOSIS — M79672 Pain in left foot: Secondary | ICD-10-CM

## 2023-06-13 DIAGNOSIS — M722 Plantar fascial fibromatosis: Secondary | ICD-10-CM

## 2023-06-13 DIAGNOSIS — E1140 Type 2 diabetes mellitus with diabetic neuropathy, unspecified: Secondary | ICD-10-CM | POA: Diagnosis not present

## 2023-06-13 DIAGNOSIS — S80862A Insect bite (nonvenomous), left lower leg, initial encounter: Secondary | ICD-10-CM

## 2023-06-13 MED ORDER — DOXYCYCLINE HYCLATE 100 MG PO CAPS: 100.0000 mg | ORAL_CAPSULE | Freq: Two times a day (BID) | ORAL | 0 refills | Status: AC

## 2023-06-13 NOTE — Progress Notes (Signed)
  Subjective:  Patient ID: Kelly Rosales, female    DOB: 08-14-1950,   MRN: [id number]  Chief Complaint  Patient presents with   Foot Pain    "My feet hurt really bad.  They hurt on the bottom." N - heel pain L - plantar bilateral D - 2 mos O - suddenly C - throb, sore A - get up from sitting pain in back of leg T - rub cream on it, Aleve ,     73 y.o. female presents for worsening pain in both of her feet that has recently been getting worse over the last couple months. States she thinks it is related to the stress of recently losing her granddaughter.  Has been in the care of Dr. Clydia Dart for foot pain and has tried different bracing. Relates shooting pains that go up her leg. She does have tingling an numbness in her feet. She has a history of peripheral neuropathy secondary to lumbar radiculopathy and history of diabetes. Has been prescribed gabapentin  in past but did not take because of side effects. Patient is diabetic and last A1c was 6.5.  PCP:  Patient, [name]   .  Aaron Aas Denies any other pedal complaints. Denies n/v/f/c.   Past Medical History:  Diagnosis Date   Achilles tendonitis 10/2010   Ankle deformity 10/2010   Chronic abdominal pain    Chronic back pain    Chronic leg pain    left   Chronic neck pain    Fibromyalgia    Hypertension    IBS (irritable bowel syndrome)    Lumbar radiculopathy 10/2010   Peripheral neuropathy 10/2010   Secondary to Lumbar Radiculopathy    Objective:  Physical Exam: Vascular: DP/PT pulses 2/4 bilateral. CFT <3 seconds. Normal hair growth on digits. No edema.  Skin. No lacerations or abrasions bilateral feet. Nails 1-5 are thickened discolored and elongated with subungual debris.   Musculoskeletal: MMT 5/5 bilateral lower extremities in DF, PF, Inversion and Eversion. Deceased ROM in DF of ankle joint. Tender to dorsal midfoot bilateral and anterior ankle joint line. Also has some tenderness to plantar heel. Relates burning and  tingling in these areas.  Neurological: Sensation intact to light touch.   Assessment:   1. Type 2 diabetes mellitus with diabetic neuropathy, without long-term current use of insulin (HCC)      Plan:  Patient was evaluated and treated and all questions answered. Discussed neuropathy and neuritis and etiology as well as treatment with patient.  Radiographs reviewed and discussed with patient. No acute fractures or dislocations noted. Retained hardware noted from previous bunion surgery. Mild degenerative changes note dot right midfoot.   -Discussed and educated patient on diabetic foot care, especially with  regards to the vascular, neurological and musculoskeletal systems.  -Stressed the importance of good glycemic control and the detriment of not  controlling glucose levels in relation to the foot. -Discussed supportive shoes at all times and checking feet regularly.  -Discussed alternative oral medications but patient worried about taking too much medication. Discussed Iantha Mainland and seeing if this is an option through insurance and patient would like to try.  -Patient to return in 3 months for follow-up evaluation.     Jennefer Moats, DPM

## 2023-06-13 NOTE — ED Provider Notes (Signed)
 [facility] [facility] Provider Note   CSN: [id number] Arrival date & time: 06/12/23  2159     History  Chief Complaint  Patient presents with   Tick Removal    Kelly Rosales is a 73 y.o. female.  The history is provided by the patient.   Patient presents to have a tick removed.  She reports she has a tick to her left lower extremity She is unsure how long it has been there.  No headache, no fever, no rash  Home Medications Prior to Admission medications   Medication Sig Start Date End Date Taking? Authorizing Provider  doxycycline (VIBRAMYCIN) 100 MG capsule Take 1 capsule (100 mg total) by mouth 2 (two) times daily. 06/13/23  Yes Eldon Greenland, MD  diclofenac  (VOLTAREN ) 50 MG EC tablet Take 1 tablet (50 mg total) by mouth 2 (two) times daily. 12/26/20   Renato Carolus, Amy L, PA  diclofenac  Sodium (VOLTAREN ) 1 % GEL Apply 2 g topically 4 (four) times daily. Rub into affected area of foot 2 to 4 times daily 02/27/21   Charity Conch, DPM  ibuprofen  (ADVIL ) 800 MG tablet Take 800 mg by mouth 3 (three) times daily. 12/26/20   [provider]  naproxen  (NAPROSYN ) 500 MG tablet Take 1 tablet (500 mg total) by mouth 2 (two) times daily. (every 12 hours with food) 04/07/21   Leon Rajas, MD      Allergies    Demerol [meperidine], Morphine and codeine, and Penicillins    Review of Systems   Review of Systems  Physical Exam Updated Vital Signs BP (!) 163/76   Pulse (!) 52   Temp 98.6 F (37 C) (Oral)   Resp 20   Ht 1.702 m (5\' 7" )   Wt 90.3 kg   SpO2 95%   BMI 31.17 kg/m  Physical Exam CONSTITUTIONAL: Well developed/well nourished HEAD: Normocephalic/atraumatic NEURO: Pt is awake/alert/appropriate, moves all extremitiesx4.  No facial droop.   EXTREMITIES: Tick identified on the left lower extremity on the calf.  No overlying erythema or rash SKIN: warm, color normal, no rash PSYCH: no abnormalities of mood noted, alert  and oriented to situation  ED Results / Procedures / Treatments   Labs (all labs ordered are listed, but only abnormal results are displayed) Labs Reviewed - No data to display  EKG None  Radiology No results found.  Procedures .Foreign Body Removal  Date/Time: 06/13/2023 2:45 AM  Performed by: Eldon Greenland, MD Authorized by: Eldon Greenland, MD  Consent: Verbal consent obtained. Body area: skin General location: lower extremity Location details: left lower leg 1 objects recovered. Objects recovered: tick Post-procedure assessment: foreign body removed Patient tolerance: patient tolerated the procedure well with no immediate complications Comments: tick was identified in the left lower extremity. The tick was removed with tweezers without difficulty      Medications Ordered in ED Medications - No data to display  ED Course/ Medical Decision Making/ A&P                                 Medical Decision Making Risk Prescription drug management.   tick removed without difficulty.  She is unsure how long it was embedded.  Will place her on doxycycline for 2 weeks to treat any tickborne illness        Final Clinical Impression(s) / ED Diagnoses Final diagnoses:  Tick bite of left lower  leg, initial encounter    Rx / DC Orders ED Discharge Orders          Ordered    doxycycline (VIBRAMYCIN) 100 MG capsule  2 times daily        06/13/23 0245              Eldon Greenland, MD 06/13/23 [phone]

## 2023-06-18 ENCOUNTER — Telehealth: Payer: Self-pay

## 2023-06-18 NOTE — Telephone Encounter (Signed)
-----   Message from Jennefer Moats sent at 06/13/2023 11:27 AM EDT ----- Regarding: Kelly Rosales Patient will diabetic neuropathy would like to consider Qtenza.   Thanks  Dr. Sikora

## 2023-06-18 NOTE — Telephone Encounter (Signed)
 Requested benefits for Qutenza

## 2023-07-01 NOTE — Telephone Encounter (Signed)
 Qutenza not covered. Patient needs to try other medications before Qutenza.
